# Patient Record
Sex: Female | Born: 1960 | Race: Black or African American | Hispanic: No | Smoking: Current some day smoker
Health system: Southern US, Community
[De-identification: ages and names within clinical notes are randomized; demographics above are authoritative.]

## PROBLEM LIST (undated history)

## (undated) DIAGNOSIS — E7800 Pure hypercholesterolemia, unspecified: Secondary | ICD-10-CM

## (undated) DIAGNOSIS — I10 Essential (primary) hypertension: Secondary | ICD-10-CM

## (undated) HISTORY — PX: ROTATOR CUFF REPAIR: SHX139

## (undated) HISTORY — PX: ABDOMINAL HYSTERECTOMY: SHX81

---

## 1998-06-06 ENCOUNTER — Other Ambulatory Visit: Admission: RE | Admit: 1998-06-06 | Discharge: 1998-06-06 | Payer: Self-pay | Admitting: Family Medicine

## 1999-06-18 ENCOUNTER — Other Ambulatory Visit: Admission: RE | Admit: 1999-06-18 | Discharge: 1999-06-18 | Payer: Self-pay | Admitting: Family Medicine

## 1999-09-24 ENCOUNTER — Encounter: Payer: Self-pay | Admitting: Family Medicine

## 1999-09-24 ENCOUNTER — Encounter: Admission: RE | Admit: 1999-09-24 | Discharge: 1999-09-24 | Payer: Self-pay | Admitting: Family Medicine

## 2000-09-30 ENCOUNTER — Encounter: Payer: Self-pay | Admitting: Family Medicine

## 2000-09-30 ENCOUNTER — Encounter: Admission: RE | Admit: 2000-09-30 | Discharge: 2000-09-30 | Payer: Self-pay | Admitting: Family Medicine

## 2000-10-06 ENCOUNTER — Encounter: Admission: RE | Admit: 2000-10-06 | Discharge: 2001-01-04 | Payer: Self-pay | Admitting: Family Medicine

## 2001-03-30 ENCOUNTER — Ambulatory Visit (HOSPITAL_BASED_OUTPATIENT_CLINIC_OR_DEPARTMENT_OTHER): Admission: RE | Admit: 2001-03-30 | Discharge: 2001-03-30 | Payer: Self-pay | Admitting: Orthopedic Surgery

## 2002-01-28 ENCOUNTER — Encounter (HOSPITAL_COMMUNITY): Admission: RE | Admit: 2002-01-28 | Discharge: 2002-04-28 | Payer: Self-pay | Admitting: Family Medicine

## 2002-02-02 ENCOUNTER — Other Ambulatory Visit: Admission: RE | Admit: 2002-02-02 | Discharge: 2002-02-02 | Payer: Self-pay | Admitting: Family Medicine

## 2002-02-03 ENCOUNTER — Encounter: Admission: RE | Admit: 2002-02-03 | Discharge: 2002-02-03 | Payer: Self-pay | Admitting: Family Medicine

## 2002-02-03 ENCOUNTER — Encounter: Payer: Self-pay | Admitting: Family Medicine

## 2002-04-19 ENCOUNTER — Inpatient Hospital Stay (HOSPITAL_COMMUNITY): Admission: RE | Admit: 2002-04-19 | Discharge: 2002-04-21 | Payer: Self-pay | Admitting: Obstetrics

## 2003-03-29 ENCOUNTER — Ambulatory Visit (HOSPITAL_COMMUNITY): Admission: RE | Admit: 2003-03-29 | Discharge: 2003-03-29 | Payer: Self-pay | Admitting: Obstetrics

## 2011-03-07 ENCOUNTER — Ambulatory Visit
Admission: RE | Admit: 2011-03-07 | Discharge: 2011-03-07 | Disposition: A | Discharge status: Home / Self Care | Payer: PRIVATE HEALTH INSURANCE | Source: Ambulatory Visit | Attending: Family Medicine | Admitting: Family Medicine

## 2011-03-07 ENCOUNTER — Other Ambulatory Visit: Payer: Self-pay | Admitting: Family Medicine

## 2011-03-07 DIAGNOSIS — R0602 Shortness of breath: Secondary | ICD-10-CM

## 2011-10-09 ENCOUNTER — Ambulatory Visit
Admission: RE | Admit: 2011-10-09 | Discharge: 2011-10-09 | Disposition: A | Discharge status: Home / Self Care | Payer: PRIVATE HEALTH INSURANCE | Source: Ambulatory Visit | Attending: Family Medicine | Admitting: Family Medicine

## 2011-10-09 ENCOUNTER — Other Ambulatory Visit: Payer: Self-pay | Admitting: Family Medicine

## 2011-10-09 DIAGNOSIS — M1250 Traumatic arthropathy, unspecified site: Secondary | ICD-10-CM

## 2011-12-18 ENCOUNTER — Other Ambulatory Visit: Payer: Self-pay | Admitting: Family Medicine

## 2011-12-18 DIAGNOSIS — R109 Unspecified abdominal pain: Secondary | ICD-10-CM

## 2011-12-19 ENCOUNTER — Ambulatory Visit
Admission: RE | Admit: 2011-12-19 | Discharge: 2011-12-19 | Disposition: A | Discharge status: Home / Self Care | Payer: PRIVATE HEALTH INSURANCE | Source: Ambulatory Visit | Attending: Family Medicine | Admitting: Family Medicine

## 2011-12-19 DIAGNOSIS — R109 Unspecified abdominal pain: Secondary | ICD-10-CM

## 2011-12-19 MED ORDER — IOHEXOL 300 MG/ML  SOLN
100.0000 mL | Freq: Once | INTRAMUSCULAR | Status: AC | PRN
  Administered 2011-12-19: 100 mL via INTRAVENOUS

## 2011-12-24 ENCOUNTER — Other Ambulatory Visit: Payer: Self-pay | Admitting: Obstetrics

## 2011-12-24 DIAGNOSIS — Z1231 Encounter for screening mammogram for malignant neoplasm of breast: Secondary | ICD-10-CM

## 2011-12-25 ENCOUNTER — Ambulatory Visit (HOSPITAL_COMMUNITY)
Admission: RE | Admit: 2011-12-25 | Discharge: 2011-12-25 | Disposition: A | Discharge status: Home / Self Care | Payer: Private Health Insurance - Indemnity | Source: Ambulatory Visit | Attending: Obstetrics | Admitting: Obstetrics

## 2011-12-25 DIAGNOSIS — Z1231 Encounter for screening mammogram for malignant neoplasm of breast: Secondary | ICD-10-CM

## 2013-01-21 ENCOUNTER — Other Ambulatory Visit: Payer: Self-pay | Admitting: Family Medicine

## 2013-01-22 ENCOUNTER — Other Ambulatory Visit: Payer: Self-pay | Admitting: Family Medicine

## 2013-07-27 ENCOUNTER — Other Ambulatory Visit: Payer: Self-pay | Admitting: Otolaryngology

## 2013-07-27 DIAGNOSIS — D333 Benign neoplasm of cranial nerves: Secondary | ICD-10-CM

## 2013-08-11 ENCOUNTER — Ambulatory Visit
Admission: RE | Admit: 2013-08-11 | Discharge: 2013-08-11 | Disposition: A | Discharge status: Home / Self Care | Payer: PRIVATE HEALTH INSURANCE | Source: Ambulatory Visit | Attending: Otolaryngology | Admitting: Otolaryngology

## 2013-08-11 DIAGNOSIS — D333 Benign neoplasm of cranial nerves: Secondary | ICD-10-CM

## 2013-08-11 MED ORDER — GADOBENATE DIMEGLUMINE 529 MG/ML IV SOLN
17.0000 mL | Freq: Once | INTRAVENOUS | Status: AC | PRN
  Administered 2013-08-11: 17 mL via INTRAVENOUS

## 2014-05-18 ENCOUNTER — Other Ambulatory Visit: Payer: Self-pay | Admitting: Family Medicine

## 2014-05-18 ENCOUNTER — Ambulatory Visit
Admission: RE | Admit: 2014-05-18 | Discharge: 2014-05-18 | Disposition: A | Discharge status: Home / Self Care | Payer: BLUE CROSS/BLUE SHIELD | Source: Ambulatory Visit | Attending: Family Medicine | Admitting: Family Medicine

## 2014-05-18 DIAGNOSIS — M545 Low back pain: Secondary | ICD-10-CM

## 2015-04-11 ENCOUNTER — Telehealth: Payer: Self-pay

## 2015-04-11 NOTE — Telephone Encounter (Signed)
FMLA PAPERWORK READY 15.00 CHARGE UNLESS PAID

## 2016-02-14 ENCOUNTER — Ambulatory Visit
Admission: RE | Admit: 2016-02-14 | Discharge: 2016-02-14 | Disposition: A | Discharge status: Home / Self Care | Payer: BLUE CROSS/BLUE SHIELD | Source: Ambulatory Visit | Attending: Family Medicine | Admitting: Family Medicine

## 2016-02-14 ENCOUNTER — Other Ambulatory Visit: Payer: Self-pay | Admitting: Family Medicine

## 2016-02-14 DIAGNOSIS — M545 Low back pain, unspecified: Secondary | ICD-10-CM

## 2016-04-24 LAB — HM COLONOSCOPY

## 2017-01-15 ENCOUNTER — Ambulatory Visit (INDEPENDENT_AMBULATORY_CARE_PROVIDER_SITE_OTHER): Payer: BLUE CROSS/BLUE SHIELD

## 2017-01-15 ENCOUNTER — Other Ambulatory Visit: Payer: Self-pay | Admitting: Podiatry

## 2017-01-15 ENCOUNTER — Ambulatory Visit: Payer: BLUE CROSS/BLUE SHIELD | Admitting: Podiatry

## 2017-01-15 DIAGNOSIS — M779 Enthesopathy, unspecified: Secondary | ICD-10-CM | POA: Diagnosis not present

## 2017-01-15 DIAGNOSIS — M79672 Pain in left foot: Secondary | ICD-10-CM

## 2017-01-15 DIAGNOSIS — M79671 Pain in right foot: Secondary | ICD-10-CM | POA: Diagnosis not present

## 2017-01-15 DIAGNOSIS — M2042 Other hammer toe(s) (acquired), left foot: Secondary | ICD-10-CM | POA: Diagnosis not present

## 2017-01-15 DIAGNOSIS — M7750 Other enthesopathy of unspecified foot: Secondary | ICD-10-CM | POA: Diagnosis not present

## 2017-01-15 MED ORDER — DICLOFENAC SODIUM 75 MG PO TBEC: 75.0000 mg | DELAYED_RELEASE_TABLET | Freq: Two times a day (BID) | ORAL | 2 refills | Status: DC

## 2017-01-15 MED ORDER — TRIAMCINOLONE ACETONIDE 10 MG/ML IJ SUSP
10.0000 mg | Freq: Once | INTRAMUSCULAR | Status: AC
  Administered 2017-01-15: 10 mg

## 2017-01-15 NOTE — Progress Notes (Signed)
   Subjective:    Patient ID: Carla Clements, female    DOB: 12/19/1960, 56 y.o.   MRN: [id number]  HPI    Review of Systems  All other systems reviewed and are negative.      Objective:   Physical Exam        Assessment & Plan:

## 2017-01-15 NOTE — Patient Instructions (Signed)
Hammer Toe Hammer toe is a change in the shape (a deformity) of your second, third, or fourth toe. The deformity causes the middle joint of your toe to stay bent. This causes pain, especially when you are wearing shoes. Hammer toe starts gradually. At first, the toe can be straightened. Gradually over time, the deformity becomes stiff and permanent. Early treatments to keep the toe straight may relieve pain. As the deformity becomes stiff and permanent, surgery may be needed to straighten the toe. What are the causes? Hammer toe is caused by abnormal bending of the toe joint that is closest to your foot. It happens gradually over time. This pulls on the muscles and connections (tendons) of the toe joint, making them weak and stiff. It is often related to wearing shoes that are too short or narrow and do not let your toes straighten. What increases the risk? You may be at greater risk for hammer toe if you:  Are female.  Are older.  Wear shoes that are too small.  Wear high-heeled shoes that pinch your toes.  Are a ballet dancer.  Have a second toe that is longer than your big toe (first toe).  Injure your foot or toe.  Have arthritis.  Have a family history of hammer toe.  Have a nerve or muscle disorder.  What are the signs or symptoms? The main symptoms of this condition are pain and deformity of the toe. The pain is worse when wearing shoes, walking, or running. Other symptoms may include:  Corns or calluses over the bent part of the toe or between the toes.  Redness and a burning feeling on the toe.  An open sore that forms on the top of the toe.  Not being able to straighten the toe.  How is this diagnosed? This condition is diagnosed based on your symptoms and a physical exam. During the exam, your health care provider will try to straighten your toe to see how stiff the deformity is. You may also have tests, such as:  A blood test to check for rheumatoid  arthritis.  An X-ray to show how severe the deformity is.  How is this treated? Treatment for this condition will depend on how stiff the deformity is. Surgery is often needed. However, sometimes a hammer toe can be straightened without surgery. Treatments that do not involve surgery include:  Taping the toe into a straightened position.  Using pads and cushions to protect the toe (orthotics).  Wearing shoes that provide enough room for the toes.  Doing toe-stretching exercises at home.  Taking an NSAID to reduce pain and swelling.  If these treatments do not help or the toe cannot be straightened, surgery is the next option. The most common surgeries used to straighten a hammer toe include:  Arthroplasty. In this procedure, part of the joint is removed, and that allows the toe to straighten.  Fusion. In this procedure, cartilage between the two bones of the joint is taken out and the bones are fused together into one longer bone.  Implantation. In this procedure, part of the bone is removed and replaced with an implant to let the toe move again.  Flexor tendon transfer. In this procedure, the tendons that curl the toes down (flexor tendons) are repositioned.  Follow these instructions at home:  Take over-the-counter and prescription medicines only as told by your health care provider.  Do toe straightening and stretching exercises as told by your health care provider.  Keep all   follow-up visits as told by your health care provider. This is important. How is this prevented?  Wear shoes that give your toes enough room and do not cause pain.  Do not wear high-heeled shoes. Contact a health care provider if:  Your pain gets worse.  Your toe becomes red or swollen.  You develop an open sore on your toe. This information is not intended to replace advice given to you by your health care provider. Make sure you discuss any questions you have with your health care  provider. Document Released: 02/09/2000 Document Revised: 09/01/2015 Document Reviewed: 06/07/2015 Elsevier Interactive Patient Education  2018 [employer]  

## 2017-01-17 NOTE — Progress Notes (Signed)
Subjective:    Patient ID: Carla Clements, female   DOB: 56 y.o.   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 Patient does not smoke and the like to be more active and states pains been present for several months    Review of Systems  All other systems reviewed and are negative.       Objective:  Physical Exam  Constitutional: She appears well-developed and well-nourished.  Cardiovascular: Intact distal pulses.  Pulmonary/Chest: Effort normal.  Musculoskeletal: Normal range of motion.  Neurological: She is alert.  Skin: Skin is warm.  Nursing note and vitals reviewed.  neurovascular status intact muscle strength adequate range of motion within normal limits with patient found to have inflammation and pain of the second MPJ bilateral with fluid buildup around the joint and pain with palpation. Patient is noted to have good digital perfusion and is well oriented 3     Assessment:    Inflammatory capsulitis second MPJ bilateral with moderate flatfoot deformity and digital deformities with elevation of the digit     Plan:   H&P conditions reviewed and did proximal block left aspirated the joint getting out a small amount of clear fluid and injected with a quarter cc dexamethasone Kenalog and applied thick plantar pad to take pressure off the joint surface. Discussed long-term orthotics and patient wants to have these made and will be seen back to reevaluate  X-rays indicate that there is moderate digital deformity left over right with no indications of advanced arthritis with moderate flatfoot deformity

## 2017-02-05 ENCOUNTER — Ambulatory Visit: Payer: BLUE CROSS/BLUE SHIELD | Admitting: Podiatry

## 2017-02-05 ENCOUNTER — Encounter: Payer: Self-pay | Admitting: Podiatry

## 2017-02-05 DIAGNOSIS — M779 Enthesopathy, unspecified: Secondary | ICD-10-CM | POA: Diagnosis not present

## 2017-02-05 NOTE — Progress Notes (Signed)
Subjective:   Patient ID: Carla Clements, female   DOB: 56 y.o.   MRN: [id number]   HPI Patient presents stating she has had quite a bit of improvement in her forefoot discomfort and stated the padding really helps take pressure off the joint and she does work long shifts and needs some kind of plantar support for her arches.   ROS      Objective:  Physical Exam  Neurovascular status intact with patient's second MPJ being tender bilateral with fifth MPJ keratotic lesion left.     Assessment:  Inflammatory capsulitis still noted with improvement but still painful upon palpation.     Plan:  Reviewed with her long-term orthotics and scanned for custom orthotics by ped orthotist and gave instructions for physical therapy and rigid bottom shoes.  Reappoint when orthotics are returned

## 2017-02-06 ENCOUNTER — Ambulatory Visit: Payer: BLUE CROSS/BLUE SHIELD | Admitting: Podiatry

## 2017-02-27 ENCOUNTER — Encounter: Payer: BLUE CROSS/BLUE SHIELD | Admitting: Orthotics

## 2017-02-27 ENCOUNTER — Ambulatory Visit (INDEPENDENT_AMBULATORY_CARE_PROVIDER_SITE_OTHER): Payer: BLUE CROSS/BLUE SHIELD | Admitting: Orthotics

## 2017-02-27 DIAGNOSIS — M779 Enthesopathy, unspecified: Secondary | ICD-10-CM

## 2017-02-27 DIAGNOSIS — M2042 Other hammer toe(s) (acquired), left foot: Secondary | ICD-10-CM

## 2017-02-27 NOTE — Progress Notes (Signed)
Patient came in today to p/up functional foot orthotics.   The orthotics were assessed to both fit and function.  The F/O addressed the biomechanical issues/pathologies as intended, offering good longitudinal arch support, proper offloading, and foot support. There weren't any signs of discomfort or irritation.  The F/O fit properly in footwear with minimal trimming/adjustments. 

## 2017-04-17 ENCOUNTER — Ambulatory Visit (INDEPENDENT_AMBULATORY_CARE_PROVIDER_SITE_OTHER): Payer: BLUE CROSS/BLUE SHIELD

## 2017-04-17 ENCOUNTER — Encounter: Payer: Self-pay | Admitting: Podiatry

## 2017-04-17 ENCOUNTER — Ambulatory Visit: Payer: BLUE CROSS/BLUE SHIELD | Admitting: Podiatry

## 2017-04-17 ENCOUNTER — Other Ambulatory Visit: Payer: Self-pay | Admitting: Podiatry

## 2017-04-17 DIAGNOSIS — M79671 Pain in right foot: Secondary | ICD-10-CM | POA: Diagnosis not present

## 2017-04-17 DIAGNOSIS — S99921A Unspecified injury of right foot, initial encounter: Secondary | ICD-10-CM

## 2017-04-17 MED ORDER — MELOXICAM 15 MG PO TABS: 15.0000 mg | ORAL_TABLET | Freq: Every day | ORAL | 2 refills | Status: DC

## 2017-04-17 NOTE — Progress Notes (Signed)
Subjective:   Patient ID: Carla Clements, female   DOB: 57 y.o.   MRN: [id number]   HPI Patient presents stating she twisted her right foot and is been very sore and she is having trouble bearing weight on it.  She states that this happened 2 days ago and it is intensely discomforting when she tries to walk   ROS      Objective:  Physical Exam  Neurovascular status intact with significant inflammation of the right foot especially around the fifth metatarsal base and across the midfoot with extreme discomfort upon palpation and mild to moderate swelling     Assessment:  Possibility for fracture of the right midfoot or dislocation injury     Plan:  H&P x-ray reviewed and today I have recommended immobilization with short air fracture walker along with aggressive ice therapy and oral anti-inflammatory therapy.  Patient will be seen back to recheck and I reviewed the fact it does not appear to be a fracture but a serious contusion.  She should be able to return to work within 2-4 weeks  X-rays were negative for signs of fracture or dislocation or Lisfranc's injury

## 2017-04-24 DIAGNOSIS — M79676 Pain in unspecified toe(s): Secondary | ICD-10-CM

## 2017-05-08 ENCOUNTER — Encounter: Payer: Self-pay | Admitting: Podiatry

## 2017-07-08 ENCOUNTER — Telehealth: Payer: Self-pay | Admitting: *Deleted

## 2017-07-08 MED ORDER — MELOXICAM 15 MG PO TABS: 15.0000 mg | ORAL_TABLET | Freq: Every day | ORAL | 2 refills | Status: DC

## 2017-07-08 NOTE — Telephone Encounter (Signed)
Refill request for meloxicam. Dr. Paulla Dolly states refill as previously and pt needs an appt prior to future refills.

## 2017-12-18 ENCOUNTER — Other Ambulatory Visit: Payer: Self-pay

## 2017-12-18 ENCOUNTER — Other Ambulatory Visit (HOSPITAL_COMMUNITY)
Admission: RE | Admit: 2017-12-18 | Discharge: 2017-12-18 | Disposition: A | Discharge status: Home / Self Care | Payer: BLUE CROSS/BLUE SHIELD | Source: Ambulatory Visit | Attending: Family Medicine | Admitting: Family Medicine

## 2017-12-18 DIAGNOSIS — N898 Other specified noninflammatory disorders of vagina: Secondary | ICD-10-CM | POA: Diagnosis present

## 2017-12-23 LAB — URINE CYTOLOGY ANCILLARY ONLY
Candida vaginitis: NEGATIVE
Chlamydia: NEGATIVE
Neisseria Gonorrhea: NEGATIVE
TRICH (WINDOWPATH): NEGATIVE

## 2018-01-06 ENCOUNTER — Other Ambulatory Visit: Payer: Self-pay | Admitting: Family Medicine

## 2018-01-06 ENCOUNTER — Other Ambulatory Visit (HOSPITAL_COMMUNITY)
Admission: RE | Admit: 2018-01-06 | Discharge: 2018-01-06 | Disposition: A | Discharge status: Home / Self Care | Payer: BLUE CROSS/BLUE SHIELD | Source: Ambulatory Visit | Attending: Family Medicine | Admitting: Family Medicine

## 2018-01-06 DIAGNOSIS — N898 Other specified noninflammatory disorders of vagina: Secondary | ICD-10-CM | POA: Diagnosis not present

## 2018-01-09 LAB — URINE CYTOLOGY ANCILLARY ONLY
BACTERIAL VAGINITIS: NEGATIVE
CANDIDA VAGINITIS: NEGATIVE
Chlamydia: NEGATIVE
Neisseria Gonorrhea: NEGATIVE
Trichomonas: NEGATIVE

## 2018-03-09 ENCOUNTER — Encounter (HOSPITAL_COMMUNITY): Payer: Self-pay

## 2018-03-09 ENCOUNTER — Ambulatory Visit (HOSPITAL_COMMUNITY)
Admission: EM | Admit: 2018-03-09 | Discharge: 2018-03-09 | Disposition: A | Discharge status: Home / Self Care | Payer: BLUE CROSS/BLUE SHIELD | Attending: Family Medicine | Admitting: Family Medicine

## 2018-03-09 ENCOUNTER — Other Ambulatory Visit: Payer: Self-pay

## 2018-03-09 DIAGNOSIS — S161XXA Strain of muscle, fascia and tendon at neck level, initial encounter: Secondary | ICD-10-CM | POA: Diagnosis not present

## 2018-03-09 DIAGNOSIS — I10 Essential (primary) hypertension: Secondary | ICD-10-CM | POA: Diagnosis not present

## 2018-03-09 HISTORY — DX: Essential (primary) hypertension: I10

## 2018-03-09 MED ORDER — KETOROLAC TROMETHAMINE 60 MG/2ML IM SOLN
INTRAMUSCULAR | Status: AC
  Filled 2018-03-09: qty 2

## 2018-03-09 MED ORDER — CYCLOBENZAPRINE HCL 10 MG PO TABS: 10.0000 mg | ORAL_TABLET | Freq: Two times a day (BID) | ORAL | 0 refills | Status: DC | PRN

## 2018-03-09 MED ORDER — KETOROLAC TROMETHAMINE 60 MG/2ML IM SOLN
60.0000 mg | Freq: Once | INTRAMUSCULAR | Status: AC
  Administered 2018-03-09: 60 mg via INTRAMUSCULAR

## 2018-03-09 NOTE — Discharge Instructions (Addendum)
Toradol injection given here for pain and inflammation I am sending a muscle relaxant to the pharmacy to take twice a day as needed for muscle spasm Be aware this medication will make you drowsy Work note given Follow up as needed for continued or worsening symptoms
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
## 2018-09-29 ENCOUNTER — Encounter (HOSPITAL_COMMUNITY): Payer: Self-pay

## 2018-09-29 ENCOUNTER — Other Ambulatory Visit: Payer: Self-pay

## 2018-09-29 ENCOUNTER — Ambulatory Visit (HOSPITAL_COMMUNITY)
Admission: EM | Admit: 2018-09-29 | Discharge: 2018-09-29 | Disposition: A | Discharge status: Home / Self Care | Payer: BLUE CROSS/BLUE SHIELD | Attending: Emergency Medicine | Admitting: Emergency Medicine

## 2018-09-29 DIAGNOSIS — Z0110 Encounter for examination of ears and hearing without abnormal findings: Secondary | ICD-10-CM

## 2018-09-29 NOTE — ED Provider Notes (Signed)
HPI  SUBJECTIVE:  Carla Clements is a 58 y.o. female who presents with a possible bug or other foreign body in her left ear starting 2 days ago.  Patient states that she removed a Bluetooth headset, and then felt something moving around in her ear.  She states it was still moving last night.  No pain, otorrhea, change in hearing.  She tried using a Q-tip in her finger without success.  There are no other aggravating or alleviating factors.  Past medical history of hypertension, borderline diabetes, hypercholesterolemia.  PMD: [facility].    Past Medical History:  Diagnosis Date  . Hypertension     Past Surgical History:  Procedure Laterality Date  . ROTATOR CUFF REPAIR      History reviewed. No pertinent family history.  Social History   Tobacco Use  . Smoking status: Current Some Day Smoker  . Smokeless tobacco: Never Used  Substance Use Topics  . Alcohol use: Yes    Frequency: Never  . Drug use: Never    No current facility-administered medications for this encounter.   Current Outpatient Medications:  .  amLODipine (NORVASC) 5 MG tablet, Take 5 mg by mouth daily., Disp: , Rfl:  .  cyclobenzaprine (FLEXERIL) 10 MG tablet, Take 1 tablet (10 mg total) by mouth 2 (two) times daily as needed for muscle spasms., Disp: 20 tablet, Rfl: 0 .  losartan-hydrochlorothiazide (HYZAAR) 50-12.5 MG tablet, Take 1 tablet by mouth daily., Disp: , Rfl:  .  simvastatin (ZOCOR) 20 MG tablet, Take 20 mg by mouth daily., Disp: , Rfl:  .  varenicline (CHANTIX PAK) 0.5 MG X 11 & 1 MG X 42 tablet, Take by mouth 2 (two) times daily. Take one 0.5 mg tablet by mouth once daily for 3 days, then increase to one 0.5 mg tablet twice daily for 4 days, then increase to one 1 mg tablet twice daily., Disp: , Rfl:   No Known Allergies   ROS  As noted in HPI.   Physical Exam  BP (!) 158/93 (BP Location: Right Arm)   Pulse 73   Temp 98.3 F (36.8 C) (Oral)   Resp 18   SpO2 99%   Constitutional:  Well developed, well nourished, no acute distress Eyes:  EOMI, conjunctiva normal bilaterally HENT: Normocephalic, atraumatic,mucus membranes moist.  Left external ear canal very small superficial abrasion in the 4 o'clock position.  TM intact.  No foreign body visualized.  Small amount of nonobstructing wax visualized Respiratory: Normal inspiratory effort Cardiovascular: Normal rate GI: nondistended skin: No rash, skin intact Musculoskeletal: no deformities Neurologic: Alert & oriented x 3, no focal neuro deficits Psychiatric: Speech and behavior appropriate   ED Course   Medications - No data to display  No orders of the defined types were placed in this encounter.   No results found for this or any previous visit (from the past 24 hour(s)). No results found.  ED Clinical Impression  1. Normal ear exam      ED Assessment/Plan  Nothing to remove, no signs of infection.  Do not think that the abrasion is big enough to warrant topical antibiotics.  Suspect that it will heal on its own.  We will have her follow-up with her doctor as needed.  No orders of the defined types were placed in this encounter.   *This clinic note was created using Dragon dictation software. Therefore, there may be occasional mistakes despite careful proofreading.   ?    Melynda Ripple, MD  09/29/18 0953  

## 2018-09-29 NOTE — ED Triage Notes (Signed)
Pt presents with foreign body in left ear X 2 days.

## 2018-09-29 NOTE — Discharge Instructions (Signed)
Have a very small scratch on the back of your ear canal, but this will heal on its own without any problem.  I do not think that she needs antibiotics at this time.  Return here if it starts to hurt, if you start having pus or other fluid, your ear, change in your hearing, or other concerns.  We can consider starting you on y antibiotic eardrops at that time.  Our eardrum was intact and looked normal today.

## 2019-03-20 ENCOUNTER — Encounter (HOSPITAL_COMMUNITY): Payer: Self-pay | Admitting: *Deleted

## 2019-03-20 ENCOUNTER — Ambulatory Visit (HOSPITAL_COMMUNITY)
Admission: EM | Admit: 2019-03-20 | Discharge: 2019-03-20 | Disposition: A | Discharge status: Home / Self Care | Payer: BC Managed Care – PPO

## 2019-03-20 ENCOUNTER — Other Ambulatory Visit: Payer: Self-pay

## 2019-03-20 DIAGNOSIS — S63502A Unspecified sprain of left wrist, initial encounter: Secondary | ICD-10-CM | POA: Diagnosis not present

## 2019-03-20 HISTORY — DX: Pure hypercholesterolemia, unspecified: E78.00

## 2019-03-20 MED ORDER — NAPROXEN 500 MG PO TABS: 500.0000 mg | ORAL_TABLET | Freq: Two times a day (BID) | ORAL | 0 refills | Status: DC

## 2019-03-20 NOTE — ED Triage Notes (Signed)
Denies known injury.  C/O left wrist pain onset @ 2100 last night.  LUE CMS intact.  Has taken Tylenol without significant relief.

## 2019-03-20 NOTE — ED Provider Notes (Signed)
[facility]    CSN: [id number] Arrival date & time: 03/20/19  1040      History   Chief Complaint Chief Complaint  Patient presents with  . Wrist Pain    HPI Carla Clements is a 59 y.o. female.   Patient here concerned with L wrist pain x last night.  Pain started after Carla Clements used her hands to push herself up off the toilet.  Admits pain lateral aspect dorsum L wrist, w/ radiation to hand/forearm.  Denies ecchymosis, swelling, n/t, admits weakness, RROM.  Carla Clements is taking tylenol w/o relief.       Past Medical History:  Diagnosis Date  . Hypercholesteremia   . Hypertension     There are no problems to display for this patient.   Past Surgical History:  Procedure Laterality Date  . ABDOMINAL HYSTERECTOMY    . ROTATOR CUFF REPAIR      OB History   No obstetric history on file.      Home Medications    Prior to Admission medications   Medication Sig Start Date End Date Taking? Authorizing Provider  amLODipine (NORVASC) 5 MG tablet Take 5 mg by mouth daily.   Yes [provider]  cyclobenzaprine (FLEXERIL) 10 MG tablet Take 1 tablet (10 mg total) by mouth 2 (two) times daily as needed for muscle spasms. 03/09/18  Yes Bast, Traci A, NP  Ferrous Sulfate (IRON PO) Take by mouth.   Yes [provider]  losartan-hydrochlorothiazide (HYZAAR) 50-12.5 MG tablet Take 1 tablet by mouth daily.   Yes [provider]  Multiple Vitamins-Minerals (CENTRUM SILVER ADULT 50+ PO) Take by mouth.   Yes [provider]  Multiple Vitamins-Minerals (HAIR SKIN AND NAILS FORMULA PO) Take by mouth.   Yes [provider]  simvastatin (ZOCOR) 20 MG tablet Take 20 mg by mouth daily.   Yes [provider]  naproxen (NAPROSYN) 500 MG tablet Take 1 tablet (500 mg total) by mouth 2 (two) times daily. 03/20/19   Peri Jefferson, PA-C  varenicline (CHANTIX PAK) 0.5 MG X 11 & 1 MG X 42 tablet Take by mouth 2 (two) times daily. Take one 0.5  mg tablet by mouth once daily for 3 days, then increase to one 0.5 mg tablet twice daily for 4 days, then increase to one 1 mg tablet twice daily.    [provider]    Family History Family History  Problem Relation Age of Onset  . Hypertension Mother   . Emphysema Mother   . Cirrhosis Father     Social History Social History   Tobacco Use  . Smoking status: Current Some Day Smoker  . Smokeless tobacco: Never Used  Substance Use Topics  . Alcohol use: Yes    Comment: rarely  . Drug use: Never     Allergies   Other   Review of Systems Review of Systems  Constitutional: Negative for appetite change, fatigue and fever.  Gastrointestinal: Negative for nausea and vomiting.  Musculoskeletal: Positive for arthralgias. Negative for joint swelling and myalgias.  Skin: Negative for color change, rash and wound.  Allergic/Immunologic: Negative for immunocompromised state.  Neurological: Negative for weakness and numbness.  Hematological: Negative for adenopathy.  Psychiatric/Behavioral: Negative for confusion and sleep disturbance.     Physical Exam Triage Vital Signs ED Triage Vitals  Enc Vitals Group     BP 03/20/19 1142 (!) 159/98     Pulse Rate 03/20/19 1142 73     Resp 03/20/19  1142 18     Temp 03/20/19 1143 98.1 F (36.7 C)     Temp Source 03/20/19 1143 Oral     SpO2 03/20/19 1142 100 %     Weight --      Height --      Head Circumference --      Peak Flow --      Pain Score 03/20/19 1141 5     Pain Loc --      Pain Edu? --      Excl. in [place]? --    No data found.  Updated Vital Signs BP (!) 145/90 (BP Location: Left Arm)   Pulse 73   Temp 98.1 F (36.7 C) (Oral)   Resp 18   SpO2 100%   Visual Acuity Right Eye Distance:   Left Eye Distance:   Bilateral Distance:    Right Eye Near:   Left Eye Near:    Bilateral Near:     Physical Exam Vitals and nursing note reviewed.  Constitutional:      General: Carla Clements is not in acute distress.     Appearance: Carla Clements is well-developed.  HENT:     Head: Normocephalic and atraumatic.  Eyes:     Conjunctiva/sclera: Conjunctivae normal.  Cardiovascular:     Rate and Rhythm: Normal rate and regular rhythm.     Heart sounds: No murmur.  Pulmonary:     Effort: Pulmonary effort is normal. No respiratory distress.     Breath sounds: Normal breath sounds.  Abdominal:     Palpations: Abdomen is soft.     Tenderness: There is no abdominal tenderness.  Musculoskeletal:     Left wrist: Tenderness and bony tenderness present. No swelling or deformity. Normal range of motion. Normal pulse.     Left hand: No tenderness or bony tenderness.     Cervical back: Neck supple.  Skin:    General: Skin is warm and dry.     Capillary Refill: Capillary refill takes less than 2 seconds.  Neurological:     General: No focal deficit present.     Mental Status: Carla Clements is alert and oriented to person, place, and time.  Psychiatric:        Mood and Affect: Mood normal.        Behavior: Behavior normal.      UC Treatments / Results  Labs (all labs ordered are listed, but only abnormal results are displayed) Labs Reviewed - No data to display  EKG   Radiology No results found.  Procedures Procedures (including critical care time)  Medications Ordered in UC Medications - No data to display  Initial Impression / Assessment and Plan / UC Course  I have reviewed the triage vital signs and the nursing notes.  Pertinent labs & imaging results that were available during my care of the patient were reviewed by me and considered in my medical decision making (see chart for details).      Final Clinical Impressions(s) / UC Diagnoses   Final diagnoses:  Sprain of left wrist, initial encounter     Discharge Instructions     Follow up with PCP Ice wrist 15 minutes 4 times per day. Wear splint while at work and sleeping. Do exercises daily. Take medication as prescribed.    ED Prescriptions      Medication Sig Dispense Auth. Provider   naproxen (NAPROSYN) 500 MG tablet Take 1 tablet (500 mg total) by mouth 2 (two) times daily. 30 tablet Peri Jefferson,  PA-C     PDMP not reviewed this encounter.   Peri Jefferson, PA-C 03/20/19 1215

## 2019-03-20 NOTE — Discharge Instructions (Addendum)
Follow up with PCP Ice wrist 15 minutes 4 times per day. Wear splint while at work and sleeping. Do exercises daily. Take medication as prescribed.

## 2020-05-01 ENCOUNTER — Other Ambulatory Visit: Payer: Self-pay

## 2020-05-01 ENCOUNTER — Other Ambulatory Visit: Payer: Self-pay | Admitting: Nurse Practitioner

## 2020-05-01 ENCOUNTER — Ambulatory Visit
Admission: RE | Admit: 2020-05-01 | Discharge: 2020-05-01 | Disposition: A | Discharge status: Home / Self Care | Payer: BC Managed Care – PPO | Source: Ambulatory Visit | Attending: Nurse Practitioner | Admitting: Nurse Practitioner

## 2020-05-01 DIAGNOSIS — M545 Low back pain, unspecified: Secondary | ICD-10-CM

## 2021-05-10 DIAGNOSIS — E669 Obesity, unspecified: Secondary | ICD-10-CM | POA: Insufficient documentation

## 2021-05-10 DIAGNOSIS — K5904 Chronic idiopathic constipation: Secondary | ICD-10-CM | POA: Insufficient documentation

## 2021-05-10 DIAGNOSIS — D509 Iron deficiency anemia, unspecified: Secondary | ICD-10-CM | POA: Insufficient documentation

## 2021-10-16 DIAGNOSIS — I10 Essential (primary) hypertension: Secondary | ICD-10-CM | POA: Insufficient documentation

## 2021-11-13 IMAGING — CR DG LUMBAR SPINE COMPLETE 4+V
5 series · 5 of 5 positions shown · non-contrast
Comparison: None.

CLINICAL DATA: Back pain

EXAM:
LUMBAR SPINE - COMPLETE 4+ VIEW

[t lumbar spine ap]
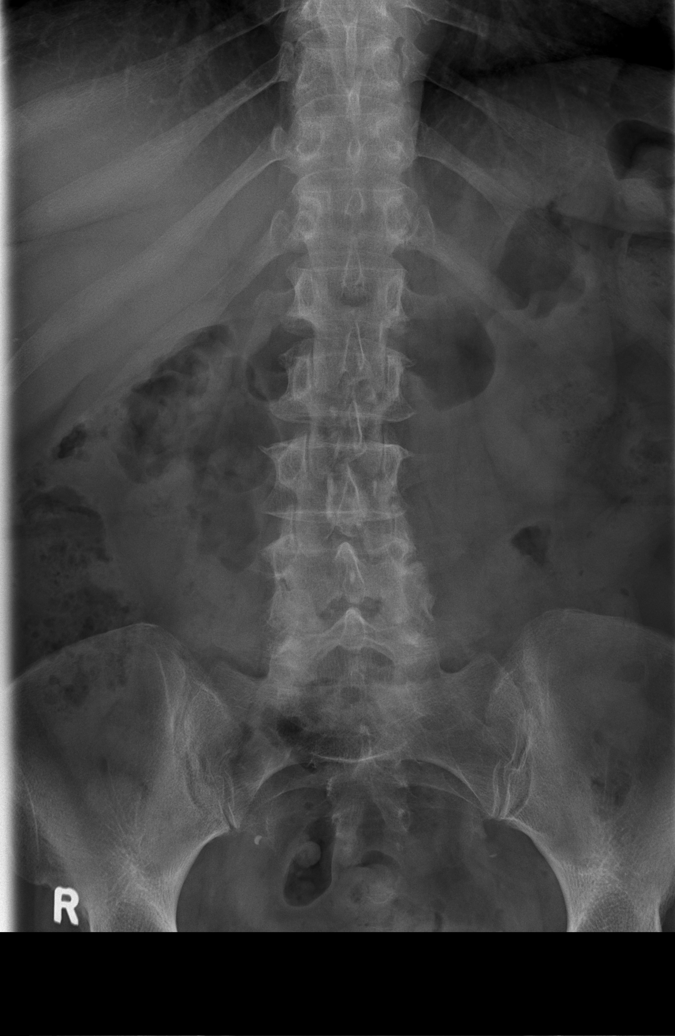

[t lumbar spine obl (1 of 2)]
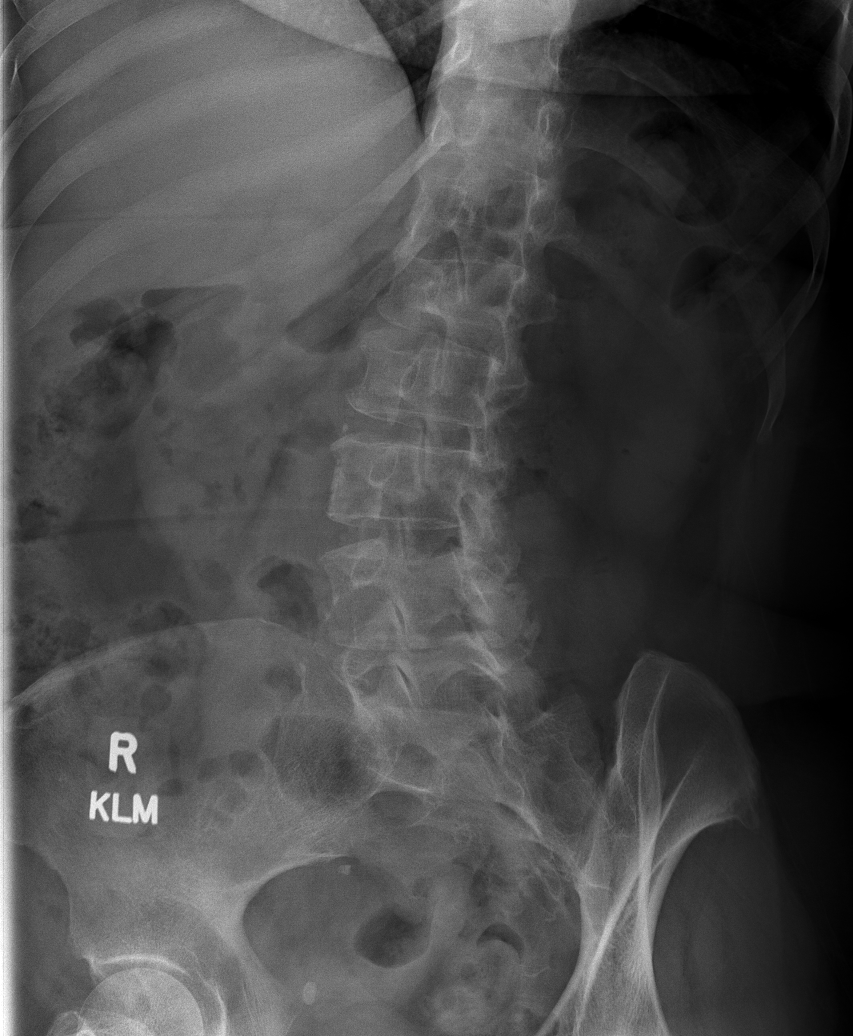

[t lumbar spine obl (2 of 2)]
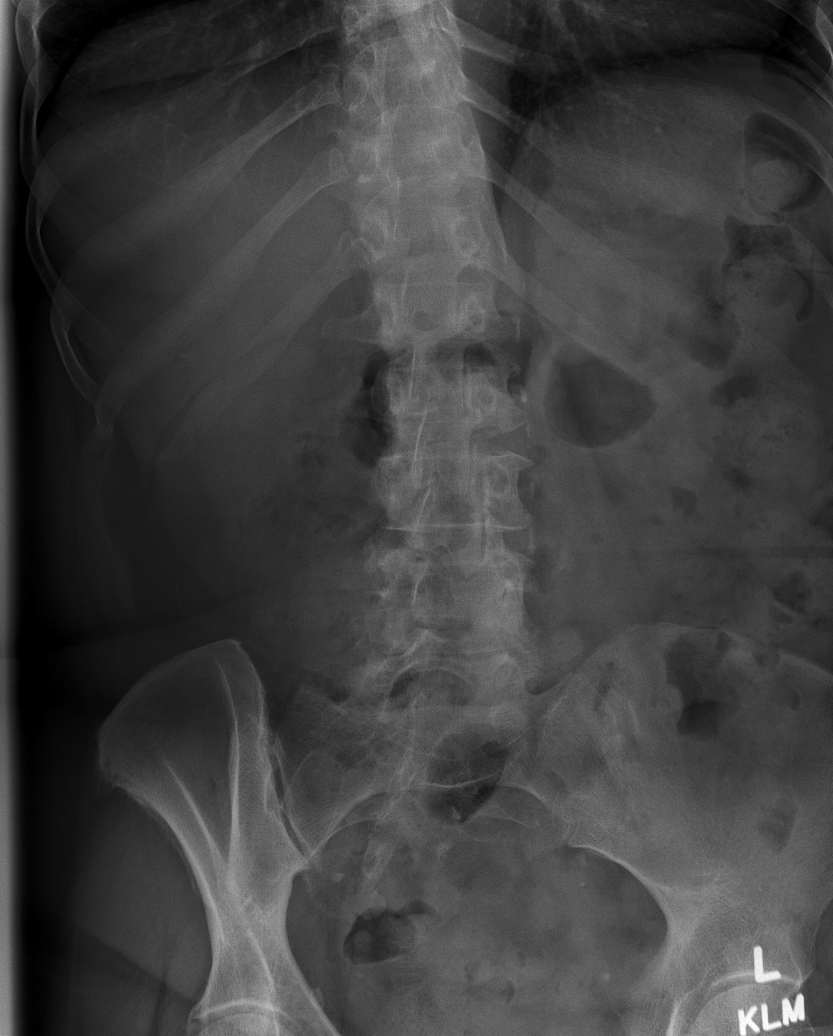

[t lumbar spine lat]
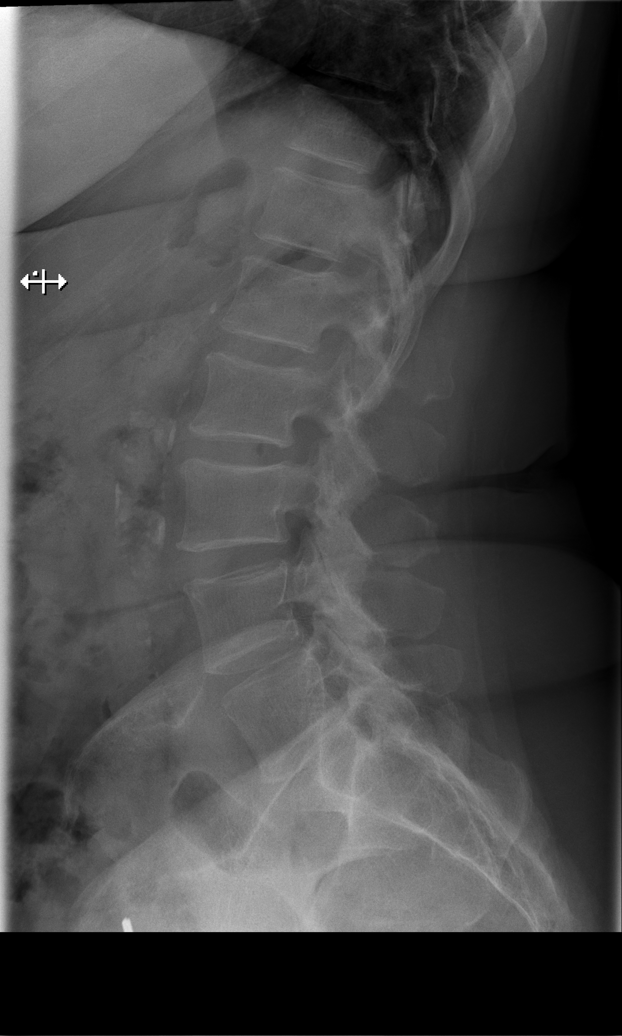

[t lumbar l-5 s-1 spot]
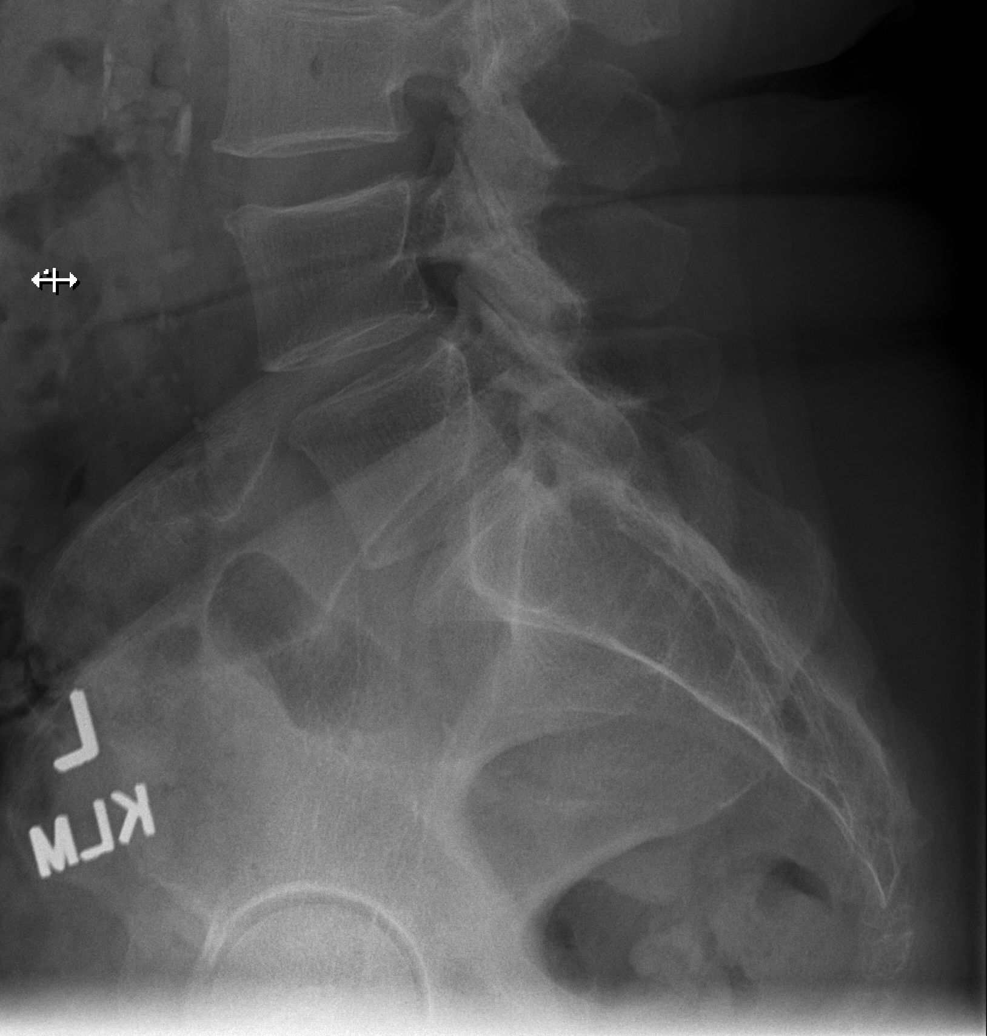

[5 of 5 positions shown; findings below may reference images not displayed]

FINDINGS: Normal lumbar lordosis. No acute fracture or listhesis of the lumbar
spine. Vertebral body height and intervertebral disc heights are
preserved. Minimal endplate remodeling is noted at L3-4 and mild
intervertebral disc space narrowing at L4-5 is noted in keeping with
changes of mild degenerative disc disease. Oblique views demonstrate
no evidence of pars defect. The paraspinal soft tissues are
unremarkable. Vascular calcifications are noted within the abdominal
aorta.
IMPRESSION: Mild degenerative disc disease.

## 2022-01-07 ENCOUNTER — Emergency Department (HOSPITAL_BASED_OUTPATIENT_CLINIC_OR_DEPARTMENT_OTHER)
Admission: EM | Admit: 2022-01-07 | Discharge: 2022-01-07 | Disposition: A | Discharge status: Home / Self Care | Payer: BC Managed Care – PPO | Attending: Emergency Medicine | Admitting: Emergency Medicine

## 2022-01-07 ENCOUNTER — Encounter (HOSPITAL_BASED_OUTPATIENT_CLINIC_OR_DEPARTMENT_OTHER): Payer: Self-pay | Admitting: Emergency Medicine

## 2022-01-07 ENCOUNTER — Other Ambulatory Visit: Payer: Self-pay

## 2022-01-07 ENCOUNTER — Emergency Department (HOSPITAL_BASED_OUTPATIENT_CLINIC_OR_DEPARTMENT_OTHER): Payer: BC Managed Care – PPO

## 2022-01-07 DIAGNOSIS — I10 Essential (primary) hypertension: Secondary | ICD-10-CM | POA: Diagnosis not present

## 2022-01-07 DIAGNOSIS — R109 Unspecified abdominal pain: Secondary | ICD-10-CM | POA: Diagnosis not present

## 2022-01-07 DIAGNOSIS — Z79899 Other long term (current) drug therapy: Secondary | ICD-10-CM | POA: Diagnosis not present

## 2022-01-07 DIAGNOSIS — Z91010 Allergy to peanuts: Secondary | ICD-10-CM | POA: Insufficient documentation

## 2022-01-07 DIAGNOSIS — R112 Nausea with vomiting, unspecified: Secondary | ICD-10-CM | POA: Insufficient documentation

## 2022-01-07 DIAGNOSIS — R197 Diarrhea, unspecified: Secondary | ICD-10-CM | POA: Diagnosis not present

## 2022-01-07 LAB — CBC
HCT: 37.3 % (ref 36.0–46.0)
Hemoglobin: 12.1 g/dL (ref 12.0–15.0)
MCH: 22.2 pg — ABNORMAL LOW (ref 26.0–34.0)
MCHC: 32.4 g/dL (ref 30.0–36.0)
MCV: 68.6 fL — ABNORMAL LOW (ref 80.0–100.0)
Platelets: 364 10*3/uL (ref 150–400)
RBC: 5.44 MIL/uL — ABNORMAL HIGH (ref 3.87–5.11)
RDW: 16.8 % — ABNORMAL HIGH (ref 11.5–15.5)
WBC: 16.1 10*3/uL — ABNORMAL HIGH (ref 4.0–10.5)
nRBC: 0 % (ref 0.0–0.2)

## 2022-01-07 LAB — COMPREHENSIVE METABOLIC PANEL
ALT: 17 U/L (ref 0–44)
AST: 19 U/L (ref 15–41)
Albumin: 4.6 g/dL (ref 3.5–5.0)
Alkaline Phosphatase: 106 U/L (ref 38–126)
Anion gap: 13 (ref 5–15)
BUN: 23 mg/dL (ref 8–23)
CO2: 26 mmol/L (ref 22–32)
Calcium: 10.6 mg/dL — ABNORMAL HIGH (ref 8.9–10.3)
Chloride: 100 mmol/L (ref 98–111)
Creatinine, Ser: 1.01 mg/dL — ABNORMAL HIGH (ref 0.44–1.00)
GFR, Estimated: >60 mL/min (ref >60)
Glucose, Bld: 123 mg/dL — ABNORMAL HIGH (ref 70–99)
Potassium: 3.3 mmol/L — ABNORMAL LOW (ref 3.5–5.1)
Sodium: 139 mmol/L (ref 135–145)
Total Bilirubin: 0.6 mg/dL (ref 0.3–1.2)
Total Protein: 9.1 g/dL — ABNORMAL HIGH (ref 6.5–8.1)

## 2022-01-07 LAB — URINALYSIS, ROUTINE W REFLEX MICROSCOPIC
Bilirubin Urine: NEGATIVE
Glucose, UA: NEGATIVE mg/dL
Hgb urine dipstick: NEGATIVE
Ketones, ur: NEGATIVE mg/dL
Leukocytes,Ua: NEGATIVE
Nitrite: NEGATIVE
Protein, ur: 30 mg/dL — AB
Specific Gravity, Urine: 1.02 (ref 1.005–1.030)
pH: 5.5 (ref 5.0–8.0)

## 2022-01-07 LAB — LIPASE, BLOOD: Lipase: 20 U/L (ref 11–51)

## 2022-01-07 MED ORDER — HYDROCODONE-ACETAMINOPHEN 5-325 MG PO TABS
1.0000 | ORAL_TABLET | Freq: Once | ORAL | Status: AC
  Administered 2022-01-07: 1 via ORAL
  Filled 2022-01-07: qty 1

## 2022-01-07 MED ORDER — POTASSIUM CHLORIDE CRYS ER 20 MEQ PO TBCR
40.0000 meq | EXTENDED_RELEASE_TABLET | Freq: Once | ORAL | Status: AC
  Administered 2022-01-07: 40 meq via ORAL
  Filled 2022-01-07: qty 2

## 2022-01-07 MED ORDER — IOHEXOL 300 MG/ML  SOLN
100.0000 mL | Freq: Once | INTRAMUSCULAR | Status: AC | PRN
  Administered 2022-01-07: 85 mL via INTRAVENOUS

## 2022-01-07 MED ORDER — ONDANSETRON HCL 4 MG PO TABS: 4.0000 mg | ORAL_TABLET | Freq: Four times a day (QID) | ORAL | 0 refills | Status: DC

## 2022-01-07 MED ORDER — SODIUM CHLORIDE 0.9 % IV BOLUS
1000.0000 mL | Freq: Once | INTRAVENOUS | Status: AC
  Administered 2022-01-07: 1000 mL via INTRAVENOUS

## 2022-01-07 MED ORDER — ONDANSETRON 4 MG PO TBDP
4.0000 mg | ORAL_TABLET | Freq: Once | ORAL | Status: AC
  Administered 2022-01-07: 4 mg via ORAL
  Filled 2022-01-07: qty 1

## 2022-01-07 NOTE — ED Notes (Signed)
PO challenge complete.

## 2022-01-07 NOTE — ED Triage Notes (Signed)
Pt arrives to ED with c/o left sided flank pain, diarrhea, and vomiting that started on 11/11.

## 2022-01-07 NOTE — Discharge Instructions (Addendum)
Note the work-up today was overall reassuring.  As discussed, recommend follow-up with PCP in 3 to 5 days for reevaluation of your symptoms.  In the meantime, take Zofran as needed for nausea/emesis or vomiting.  Recommend bland diet.  Please not hesitate to return to emergency department if the worrisome signs and symptoms we discussed become apparent.

## 2022-01-07 NOTE — ED Provider Notes (Addendum)
[facility] EMERGENCY DEPT Provider Note   CSN: [id number] Arrival date & time: 01/07/22  0901     History  Chief Complaint  Patient presents with   Flank Pain   Emesis   Diarrhea    Carla Clements is a 61 y.o. female.   Flank Pain  Emesis Associated symptoms: diarrhea   Diarrhea Associated symptoms: vomiting     61 year old female presents emergency department with complaints of left-sided flank pain, diarrhea and vomiting that began abruptly 2 days ago.  Patient states that she was sitting and being in noted acute onset left-sided flank pain.  Since then, she noted associated symptoms of nausea with vomiting without hematemesis.  Reports some diffuse mild anterior abdominal tenderness.  Denies fever, chills, night sweats, chest pain, shortness of breath, cough, congestion, sore throat, urinary/vaginal symptoms, change in bowel habits.  She states she is taken no medication for this.  She states that p.o. intake has been limited secondary to feelings of nausea and episodes of emesis.  Denies history of abdominal surgeries or nephrolithiasis.  Past medical history significant for hypertension, hypercholesterolemia.  Home Medications Prior to Admission medications   Medication Sig Start Date End Date Taking? Authorizing Provider  ondansetron (ZOFRAN) 4 MG tablet Take 1 tablet (4 mg total) by mouth every 6 (six) hours. 01/07/22  Yes Dion Saucier A, PA  amLODipine (NORVASC) 5 MG tablet Take 5 mg by mouth daily.    [provider]  cyclobenzaprine (FLEXERIL) 10 MG tablet Take 1 tablet (10 mg total) by mouth 2 (two) times daily as needed for muscle spasms. 03/09/18   Loura Halt A, NP  Ferrous Sulfate (IRON PO) Take by mouth.    [provider]  losartan-hydrochlorothiazide (HYZAAR) 50-12.5 MG tablet Take 1 tablet by mouth daily.    [provider]  Multiple Vitamins-Minerals (CENTRUM SILVER ADULT 50+ PO) Take by mouth.    [provider]  Multiple Vitamins-Minerals (HAIR SKIN AND NAILS FORMULA PO) Take by mouth.    [provider]  naproxen (NAPROSYN) 500 MG tablet Take 1 tablet (500 mg total) by mouth 2 (two) times daily. 03/20/19   Peri Jefferson, PA-C  simvastatin (ZOCOR) 20 MG tablet Take 20 mg by mouth daily.    [provider]  varenicline (CHANTIX PAK) 0.5 MG X 11 & 1 MG X 42 tablet Take by mouth 2 (two) times daily. Take one 0.5 mg tablet by mouth once daily for 3 days, then increase to one 0.5 mg tablet twice daily for 4 days, then increase to one 1 mg tablet twice daily.    [provider]      Allergies    Citric acid, Other, and Peanut-containing drug products    Review of Systems   Review of Systems  Gastrointestinal:  Positive for diarrhea and vomiting.  Genitourinary:  Positive for flank pain.  All other systems reviewed and are negative.   Physical Exam Updated Vital Signs BP 131/82   Pulse 75   Temp 98.7 F (37.1 C) (Oral)   Resp (!) 26   Ht '5\' 7"'$  (1.702 m)   Wt 86.2 kg   SpO2 98%   BMI 29.76 kg/m  Physical Exam Vitals and nursing note reviewed.  Constitutional:      General: She is not in acute distress.    Appearance: She is well-developed.  HENT:     Head: Normocephalic and atraumatic.  Eyes:     Conjunctiva/sclera: Conjunctivae normal.  Cardiovascular:  Rate and Rhythm: Normal rate and regular rhythm.     Heart sounds: No murmur heard. Pulmonary:     Effort: Pulmonary effort is normal. No respiratory distress.     Breath sounds: Normal breath sounds. No stridor. No wheezing, rhonchi or rales.  Abdominal:     Palpations: Abdomen is soft.     Tenderness: There is no abdominal tenderness. There is left CVA tenderness. There is no right CVA tenderness or guarding.  Musculoskeletal:        General: No swelling.     Cervical back: Normal range of motion and neck supple. No rigidity or tenderness.     Right lower leg: No edema.     Left  lower leg: No edema.  Skin:    General: Skin is warm and dry.     Capillary Refill: Capillary refill takes less than 2 seconds.  Neurological:     Mental Status: She is alert.  Psychiatric:        Mood and Affect: Mood normal.     ED Results / Procedures / Treatments   Labs (all labs ordered are listed, but only abnormal results are displayed) Labs Reviewed  COMPREHENSIVE METABOLIC PANEL - Abnormal; Notable for the following components:      Result Value   Potassium 3.3 (*)    Glucose, Bld 123 (*)    Creatinine, Ser 1.01 (*)    Calcium 10.6 (*)    Total Protein 9.1 (*)    All other components within normal limits  CBC - Abnormal; Notable for the following components:   WBC 16.1 (*)    RBC 5.44 (*)    MCV 68.6 (*)    MCH 22.2 (*)    RDW 16.8 (*)    All other components within normal limits  URINALYSIS, ROUTINE W REFLEX MICROSCOPIC - Abnormal; Notable for the following components:   Protein, ur 30 (*)    All other components within normal limits  LIPASE, BLOOD    EKG None  Radiology CT Abdomen Pelvis W Contrast  Result Date: 01/07/2022 CLINICAL DATA:  Abdominal pain, acute, nonlocalized. Left-sided flank pain, vomiting, and diarrhea. EXAM: CT ABDOMEN AND PELVIS WITH CONTRAST TECHNIQUE: Multidetector CT imaging of the abdomen and pelvis was performed using the standard protocol following bolus administration of intravenous contrast. RADIATION DOSE REDUCTION: This exam was performed according to the departmental dose-optimization program which includes automated exposure control, adjustment of the mA and/or kV according to patient size and/or use of iterative reconstruction technique. CONTRAST:  60m OMNIPAQUE IOHEXOL 300 MG/ML  SOLN COMPARISON:  CT abdomen and pelvis 12/19/2011 FINDINGS: Lower chest: Minimal dependent atelectasis in the lung bases. Small fat-containing left-sided Bochdalek's hernia. Hepatobiliary: No focal liver abnormality is seen. No gallstones, gallbladder  wall thickening, or biliary dilatation. Pancreas: Unremarkable. Spleen: Unremarkable. Adrenals/Urinary Tract: Unremarkable adrenal glands. No evidence of a renal mass, calculi, or hydronephrosis. Unremarkable bladder. Stomach/Bowel: The stomach is unremarkable. There is no evidence of bowel obstruction or inflammation. The appendix is unremarkable. Vascular/Lymphatic: Abdominal aortic atherosclerosis without aneurysm. No enlarged lymph nodes. Reproductive: Status post hysterectomy. No adnexal masses. Other: No ascites or pneumoperitoneum. Musculoskeletal: No acute osseous abnormality or suspicious osseous lesion. Moderately advanced lumbar facet arthrosis. IMPRESSION: 1. No acute abnormality identified in the abdomen or pelvis. 2.  Aortic Atherosclerosis (ICD10-I70.0). Electronically Signed   By: ALogan BoresM.D.   On: 01/07/2022 14:52    Procedures Procedures    Medications Ordered in ED Medications  ondansetron (ZOFRAN-ODT) disintegrating tablet 4  mg (4 mg Oral Given 01/07/22 1346)  HYDROcodone-acetaminophen (NORCO/VICODIN) 5-325 MG per tablet 1 tablet (1 tablet Oral Given 01/07/22 1357)  sodium chloride 0.9 % bolus 1,000 mL (1,000 mLs Intravenous New Bag/Given 01/07/22 1416)  potassium chloride SA (KLOR-CON M) CR tablet 40 mEq (40 mEq Oral Given 01/07/22 1357)  iohexol (OMNIPAQUE) 300 MG/ML solution 100 mL (85 mLs Intravenous Contrast Given 01/07/22 1431)    ED Course/ Medical Decision Making/ A&P Clinical Course as of 01/07/22 1539  Mon Jan 07, 2022  1525 CT Abdomen Pelvis W Contrast [CR]    Clinical Course User Index [CR] Wilnette Kales, PA                           Medical Decision Making Amount and/or Complexity of Data Reviewed Labs: ordered. Radiology: ordered. Decision-making details documented in ED Course.  Risk Prescription drug management.   This patient presents to the ED for concern of abdominal pain, this involves an extensive number of treatment options, and is  a complaint that carries with it a high risk of complications and morbidity.  The differential diagnosis includes discontinue ischemia, nephrolithiasis, cholelithiasis, pyelonephritis, diverticulitis, volvulus, cholecystitis, diverticulitis, ovarian torsion, SBO/LBO, AAA, aortic dissection   Co morbidities that complicate the patient evaluation  See HPI   Additional history obtained:  Additional history obtained from EMR External records from outside source obtained and reviewed including hospital records   Lab Tests:  I Ordered, and personally interpreted labs.  The pertinent results include: Leukocytosis of 16.1.  No evidence of anemia.  Platelets within normal range.  Mild hypokalemia which is supplemented orally while in the emergency department.  Mild hypercalcemia of 10.6.  No transaminitis noted.  Mild elevation of creatinine with no elevation of BUN and normal GFR.  UA significant for 30 proteins but within normal range otherwise.  Lipase within normal range.   Imaging Studies ordered:  I ordered imaging studies including CT abdomen pelvis. I independently visualized and interpreted imaging which showed no acute abnormalities.  Aortic atherosclerosis.   I agree with the radiologist interpretation   Cardiac Monitoring: / EKG:  The patient was maintained on a cardiac monitor.  I personally viewed and interpreted the cardiac monitored which showed an underlying rhythm of: Sinus rhythm   Consultations Obtained:  N/a   Problem List / ED Course / Critical interventions / Medication management  Viral gastroenteritis I ordered medication including 1 L normal saline, Zofran for nausea, Norco for pain, potassium 4 hypokalemia.    Reevaluation of the patient after these medicines showed that the patient improved I have reviewed the patients home medicines and have made adjustments as needed   Social Determinants of Health:  Denies tobacco, illicit drug use.   Test /  Admission - Considered:  Abdominal pain Vitals signs within normal range and stable throughout visit. Laboratory/imaging studies significant for: See above Patient symptoms likely secondary to viral gastroenteritis.  No acute abdominal pathology found on abdominal exam.  No perinephric stranding or evidence of nephrolithiasis/ureterolithiasis.  Patient's left flank pain could be muscular in pathology.  Doubt AAA/aortic dissection given symmetric pulses, normal blood pressure and lack of concerning findings on HPI.  Patient to be scribed Zofran to take as needed for nausea/emesis.  She states that diarrhea has ceased since being in the emergency department.  Patient tolerated p.o. in emergency department without difficulty with both solids and liquids.  Recommended bland/brat diet outpatient initially.  Follow-up with  PCP recommended in 3 to 5 days reevaluation.  Treatment plan discussed with patient she can understand was agreeable to said plan. Worrisome signs and symptoms were discussed with the patient, and the patient acknowledged understanding to return to the ED if noticed. Patient was stable upon discharge.          Final Clinical Impression(s) / ED Diagnoses Final diagnoses:  Abdominal pain, unspecified abdominal location  Nausea vomiting and diarrhea    Rx / DC Orders ED Discharge Orders          Ordered    ondansetron (ZOFRAN) 4 MG tablet  Every 6 hours        01/07/22 1526              Wilnette Kales, [place] 01/07/22 1539    Tegeler, Gwenyth Allegra, MD 01/07/22 [place], [place], [place] 01/07/22 1541    Tegeler, Gwenyth Allegra, MD 01/07/22 1558

## 2022-02-07 ENCOUNTER — Encounter: Payer: Self-pay | Admitting: Nurse Practitioner

## 2022-02-07 ENCOUNTER — Ambulatory Visit: Payer: BC Managed Care – PPO | Admitting: Nurse Practitioner

## 2022-02-07 VITALS — BP 122/70 | HR 86 | Temp 98.5°F | Ht 67.0 in | Wt 187.0 lb

## 2022-02-07 DIAGNOSIS — M2550 Pain in unspecified joint: Secondary | ICD-10-CM | POA: Diagnosis not present

## 2022-02-07 DIAGNOSIS — Z114 Encounter for screening for human immunodeficiency virus [HIV]: Secondary | ICD-10-CM

## 2022-02-07 DIAGNOSIS — Z862 Personal history of diseases of the blood and blood-forming organs and certain disorders involving the immune mechanism: Secondary | ICD-10-CM

## 2022-02-07 DIAGNOSIS — Z7689 Persons encountering health services in other specified circumstances: Secondary | ICD-10-CM

## 2022-02-07 DIAGNOSIS — R5383 Other fatigue: Secondary | ICD-10-CM

## 2022-02-07 DIAGNOSIS — F3289 Other specified depressive episodes: Secondary | ICD-10-CM | POA: Diagnosis not present

## 2022-02-07 DIAGNOSIS — Z1159 Encounter for screening for other viral diseases: Secondary | ICD-10-CM

## 2022-02-07 DIAGNOSIS — E782 Mixed hyperlipidemia: Secondary | ICD-10-CM

## 2022-02-07 DIAGNOSIS — Z2821 Immunization not carried out because of patient refusal: Secondary | ICD-10-CM

## 2022-02-07 DIAGNOSIS — Z720 Tobacco use: Secondary | ICD-10-CM

## 2022-02-07 MED ORDER — ESCITALOPRAM OXALATE 10 MG PO TABS: 10.0000 mg | ORAL_TABLET | Freq: Every day | ORAL | 1 refills | Status: DC

## 2022-02-07 MED ORDER — MELOXICAM 15 MG PO TABS: 15.0000 mg | ORAL_TABLET | Freq: Every day | ORAL | 1 refills | Status: DC

## 2022-02-07 MED ORDER — SIMVASTATIN 20 MG PO TABS: 20.0000 mg | ORAL_TABLET | Freq: Every day | ORAL | 1 refills | Status: DC

## 2022-02-07 MED ORDER — FERROUS SULFATE 325 (65 FE) MG PO TBEC: 325.0000 mg | DELAYED_RELEASE_TABLET | Freq: Two times a day (BID) | ORAL | 3 refills | Status: DC

## 2022-02-07 MED ORDER — LOSARTAN POTASSIUM-HCTZ 50-12.5 MG PO TABS: 1.0000 | ORAL_TABLET | Freq: Every day | ORAL | 1 refills | Status: DC

## 2022-02-07 NOTE — Patient Instructions (Addendum)
Hypertension, Adult High blood pressure (hypertension) is when the force of blood pumping through the arteries is too strong. The arteries are the blood vessels that carry blood from the heart throughout the body. Hypertension forces the heart to work harder to pump blood and may cause arteries to become narrow or stiff. Untreated or uncontrolled hypertension can lead to a heart attack, heart failure, a stroke, kidney disease, and other problems. A blood pressure reading consists of a higher number over a lower number. Ideally, your blood pressure should be below 120/80. The first ("top") number is called the systolic pressure. It is a measure of the pressure in your arteries as your heart beats. The second ("bottom") number is called the diastolic pressure. It is a measure of the pressure in your arteries as the heart relaxes. What are the causes? The exact cause of this condition is not known. There are some conditions that result in high blood pressure. What increases the risk? Certain factors may make you more likely to develop high blood pressure. Some of these risk factors are under your control, including: Smoking. Not getting enough exercise or physical activity. Being overweight. Having too much fat, sugar, calories, or salt (sodium) in your diet. Drinking too much alcohol. Other risk factors include: Having a personal history of heart disease, diabetes, high cholesterol, or kidney disease. Stress. Having a family history of high blood pressure and high cholesterol. Having obstructive sleep apnea. Age. The risk increases with age. What are the signs or symptoms? High blood pressure may not cause symptoms. Very high blood pressure (hypertensive crisis) may cause: Headache. Fast or irregular heartbeats (palpitations). Shortness of breath. Nosebleed. Nausea and vomiting. Vision changes. Severe chest pain, dizziness, and seizures. How is this diagnosed? This condition is diagnosed by  measuring your blood pressure while you are seated, with your arm resting on a flat surface, your legs uncrossed, and your feet flat on the floor. The cuff of the blood pressure monitor will be placed directly against the skin of your upper arm at the level of your heart. Blood pressure should be measured at least twice using the same arm. Certain conditions can cause a difference in blood pressure between your right and left arms. If you have a high blood pressure reading during one visit or you have normal blood pressure with other risk factors, you may be asked to: Return on a different day to have your blood pressure checked again. Monitor your blood pressure at home for 1 week or longer. If you are diagnosed with hypertension, you may have other blood or imaging tests to help your health care provider understand your overall risk for other conditions. How is this treated? This condition is treated by making healthy lifestyle changes, such as eating healthy foods, exercising more, and reducing your alcohol intake. You may be referred for counseling on a healthy diet and physical activity. Your health care provider may prescribe medicine if lifestyle changes are not enough to get your blood pressure under control and if: Your systolic blood pressure is above 130. Your diastolic blood pressure is above 80. Your personal target blood pressure may vary depending on your medical conditions, your age, and other factors. Follow these instructions at home: Eating and drinking  Eat a diet that is high in fiber and potassium, and low in sodium, added sugar, and fat. An example of this eating plan is called the DASH diet. DASH stands for Dietary Approaches to Stop Hypertension. To eat this way: Eat   plenty of fresh fruits and vegetables. Try to fill one half of your plate at each meal with fruits and vegetables. Eat whole grains, such as whole-wheat pasta, brown rice, or whole-grain bread. Fill about one  fourth of your plate with whole grains. Eat or drink low-fat dairy products, such as skim milk or low-fat yogurt. Avoid fatty cuts of meat, processed or cured meats, and poultry with skin. Fill about one fourth of your plate with lean proteins, such as fish, chicken without skin, beans, eggs, or tofu. Avoid pre-made and processed foods. These tend to be higher in sodium, added sugar, and fat. Reduce your daily sodium intake. Many people with hypertension should eat less than 1,500 mg of sodium a day. Do not drink alcohol if: Your health care provider tells you not to drink. You are pregnant, may be pregnant, or are planning to become pregnant. If you drink alcohol: Limit how much you have to: 0-1 drink a day for women. 0-2 drinks a day for men. Know how much alcohol is in your drink. In the [place] one drink equals one 12 oz bottle of beer (355 mL), one 5 oz glass of wine (148 mL), or one 1 oz glass of hard liquor (44 mL). Lifestyle  Work with your health care provider to maintain a healthy body weight or to lose weight. Ask what an ideal weight is for you. Get at least 30 minutes of exercise that causes your heart to beat faster (aerobic exercise) most days of the week. Activities may include walking, swimming, or biking. Include exercise to strengthen your muscles (resistance exercise), such as Pilates or lifting weights, as part of your weekly exercise routine. Try to do these types of exercises for 30 minutes at least 3 days a week. Do not use any products that contain nicotine or tobacco. These products include cigarettes, chewing tobacco, and vaping devices, such as e-cigarettes. If you need help quitting, ask your health care provider. Monitor your blood pressure at home as told by your health care provider. Keep all follow-up visits. This is important. Medicines Take over-the-counter and prescription medicines only as told by your health care provider. Follow directions carefully. Blood  pressure medicines must be taken as prescribed. Do not skip doses of blood pressure medicine. Doing this puts you at risk for problems and can make the medicine less effective. Ask your health care provider about side effects or reactions to medicines that you should watch for. Contact a health care provider if you: Think you are having a reaction to a medicine you are taking. Have headaches that keep coming back (recurring). Feel dizzy. Have swelling in your ankles. Have trouble with your vision. Get help right away if you: Develop a severe headache or confusion. Have unusual weakness or numbness. Feel faint. Have severe pain in your chest or abdomen. Vomit repeatedly. Have trouble breathing. These symptoms may be an emergency. Get help right away. Call 911. Do not wait to see if the symptoms will go away. Do not drive yourself to the hospital. Summary Hypertension is when the force of blood pumping through your arteries is too strong. If this condition is not controlled, it may put you at risk for serious complications. Your personal target blood pressure may vary depending on your medical conditions, your age, and other factors. For most people, a normal blood pressure is less than 120/80. Hypertension is treated with lifestyle changes, medicines, or a combination of both. Lifestyle changes include losing weight, eating a healthy,   low-sodium diet, exercising more, and limiting alcohol. This information is not intended to replace advice given to you by your health care provider. Make sure you discuss any questions you have with your health care provider. Document Revised: 12/19/2020 Document Reviewed: 12/19/2020 Elsevier Patient Education  [place].   Joint Pain Joint pain may be caused by many things. Joint pain is likely to go away when you follow instructions from your health care provider for relieving pain at home. However, joint pain can also be caused by conditions  that require more treatment. Common causes of joint pain include: Bruising in the area of the joint. Injury caused by repeating certain movements too many times. Age-related joint wear and tear. Buildup of uric acid crystals in the joint (gout). Inflammation of the joint. Other forms of arthritis. Infections of the joint or of the bone. Your health care provider may recommend that you take pain medicine or wear a supportive device like an elastic bandage, sling, or splint. If your joint pain continues, you may need lab or imaging tests to diagnose the cause of your joint pain. Follow these instructions at home: Managing pain, stiffness, and swelling     If directed, put ice on the painful area. To do this: If you have a removable elastic bandage, sling, or splint, take it off as told by your doctor. Put ice in a plastic bag. Place a towel between your skin and the bag. Leave the ice on for 20 minutes, 2-3 times a day. Remove the ice if your skin turns bright red. This is very important. If you cannot feel pain, heat, or cold, you have a greater risk of damage to the area. Move your fingers and toes often to reduce stiffness and swelling. Raise the injured area above the level of your heart while you are sitting or lying down. If directed, apply heat to the painful area as often as told by your health care provider. Use the heat source that your health care provider recommends, such as a moist heat pack or a heating pad. Place a towel between your skin and the heat source. Leave the heat on for 20-30 minutes. Remove the heat if your skin turns bright red. This is especially important if you are unable to feel pain, heat, or cold. You have a greater risk of getting burned.  Activity Rest as told by your health care provider. Do not do anything that causes or worsens pain. Begin exercising or stretching the affected area as told by your health care provider. Return to your normal  activities as told by your health care provider. Ask your health care provider what activities are safe for you. If you have an elastic bandage, sling, or splint: Wear the bandage, sling, or splint as told by your health care provider. Remove it only as told by your health care provider. Loosen it if your fingers or toes below the joint tingle, become numb, or turn cold and blue. Keep it clean. Ask your health care provider if you should remove it before bathing. If the bandage, sling, or splint is not waterproof: Do not let it get wet. Cover it with a watertight covering when you take a bath or shower. General instructions Treatment may include medicines for pain and inflammation that are taken by mouth or applied to the skin. Take over-the-counter and prescription medicines only as told by your health care provider. Do not use any products that contain nicotine or tobacco, such as cigarettes,  e-cigarettes, and chewing tobacco. If you need help quitting, ask your health care provider. Keep all follow-up visits. This is important. Contact a health care provider if: You have pain that gets worse and does not get better with medicine. Your joint pain does not improve within 3 days. You have increased bruising or swelling. You have a fever. You lose 10 lb (4.5 kg) or more without trying. Get help right away if: You cannot move the joint. Your fingers or toes tingle, become numb, or turn cold and blue. You have a fever along with a joint that is red, warm, and swollen. Summary Joint pain may be caused by many things. Your health care provider may recommend that you take pain medicine or wear a supportive device such as an elastic bandage, sling, or splint. If your joint pain continues, you may need tests to diagnose the cause of your joint pain. Take over-the-counter and prescription medicines only as told by your health care provider. This information is not intended to replace advice given  to you by your health care provider. Make sure you discuss any questions you have with your health care provider. Document Revised: 05/26/2019 Document Reviewed: 05/26/2019 Elsevier Patient Education  [place].

## 2022-02-07 NOTE — Progress Notes (Signed)
I,Tianna Badgett,acting as a Education administrator for [employer], FNP.,have documented all relevant documentation on the behalf of Minette Brine, FNP,as directed by  Minette Brine, FNP while in the presence of Minette Brine, [place].  Subjective:     Patient ID: Carla Clements , female    DOB: 16-Jan-1961 , 61 y.o.   MRN: [id number]   Chief Complaint  Patient presents with   Establish Care    HPI  Patient presents today to establish care. She was going to [employer]. Had went to urgent care on [address] then [facility]. She works at [employer]. Divorced. She had 2 children one daughter and one son who passed from sickle cell.   PMH - HTN (for more than 10 years), hypercholesteremia.  She eats an increased amount of seafood.   She has had a hysterectomy in 1993. She denies having ovarian or cervical cancer.   Father - passed from cirrhosis of liver. Mother - COPD - deceased. Brother - healthy     Past Medical History:  Diagnosis Date   Hypercholesteremia    Hypertension      Family History  Problem Relation Age of Onset   Hypertension Mother    Emphysema Mother    Cirrhosis Father      Current Outpatient Medications:    ferrous sulfate 325 (65 FE) MG EC tablet, Take 1 tablet (325 mg total) by mouth 2 (two) times daily., Disp: 60 tablet, Rfl: 3   meloxicam (MOBIC) 15 MG tablet, Take 1 tablet (15 mg total) by mouth daily., Disp: 30 tablet, Rfl: 1   ondansetron (ZOFRAN) 4 MG tablet, Take 1 tablet (4 mg total) by mouth every 6 (six) hours., Disp: 12 tablet, Rfl: 0   cyclobenzaprine (FLEXERIL) 10 MG tablet, Take 1 tablet (10 mg total) by mouth 2 (two) times daily as needed for muscle spasms., Disp: 20 tablet, Rfl: 0   escitalopram (LEXAPRO) 10 MG tablet, Take 1 tablet (10 mg total) by mouth daily., Disp: 90 tablet, Rfl: 1   losartan-hydrochlorothiazide (HYZAAR) 50-12.5 MG tablet, Take 1 tablet by mouth daily., Disp: 90 tablet, Rfl: 1   simvastatin (ZOCOR) 20 MG tablet, Take 1 tablet  (20 mg total) by mouth at bedtime., Disp: 90 tablet, Rfl: 1   Vitamin D, Ergocalciferol, (DRISDOL) 1.25 MG (50000 UNIT) CAPS capsule, Take 1 capsule (50,000 Units total) by mouth every 7 (seven) days., Disp: 12 capsule, Rfl: 1   Allergies  Allergen Reactions   Citric Acid Other (See Comments)   Other Swelling    peanuts   Peanut-Containing Drug Products Other (See Comments)     Review of Systems  Constitutional:  Positive for fatigue.  HENT: Negative.    Eyes: Negative.   Respiratory: Negative.    Cardiovascular: Negative.   Gastrointestinal: Negative.   Endocrine: Negative.   Allergic/Immunologic: Negative.   Neurological: Negative.   Hematological: Negative.        History of anemia  Psychiatric/Behavioral: Negative.       Today's Vitals   02/07/22 1000  BP: 122/70  Pulse: 86  Temp: 98.5 F (36.9 C)  TempSrc: Oral  Weight: 187 lb (84.8 kg)  Height: '5\' 7"'$  (1.702 m)   Body mass index is 29.29 kg/m.   Objective:  Physical Exam Vitals reviewed.  Constitutional:      Appearance: She is well-developed.  HENT:     Head: Normocephalic and atraumatic.  Eyes:     Pupils: Pupils are equal, round, and reactive to light.  Cardiovascular:  Rate and Rhythm: Normal rate and regular rhythm.     Pulses: Normal pulses.     Heart sounds: Normal heart sounds. No murmur heard. Pulmonary:     Effort: Pulmonary effort is normal. No respiratory distress.     Breath sounds: Normal breath sounds. No wheezing.  Musculoskeletal:        General: Normal range of motion.  Skin:    General: Skin is warm and dry.     Capillary Refill: Capillary refill takes less than 2 seconds.  Neurological:     General: No focal deficit present.     Mental Status: She is alert and oriented to person, place, and time.     Cranial Nerves: No cranial nerve deficit.     Motor: No weakness.  Psychiatric:        Mood and Affect: Mood normal.         Assessment And Plan:     1. Arthralgia,  unspecified joint - meloxicam (MOBIC) 15 MG tablet; Take 1 tablet (15 mg total) by mouth daily.  Dispense: 30 tablet; Refill: 1  2. Mixed hyperlipidemia - simvastatin (ZOCOR) 20 MG tablet; Take 1 tablet (20 mg total) by mouth at bedtime.  Dispense: 90 tablet; Refill: 1  3. Other fatigue Comments: Will check for metabolic causes - TSH - Vitamin B12 - Vitamin D (25 hydroxy) - Iron, TIBC and Ferritin Panel  4. Other depression Comments: Currently well-controlled per patient, continue escitalopram - escitalopram (LEXAPRO) 10 MG tablet; Take 1 tablet (10 mg total) by mouth daily.  Dispense: 90 tablet; Refill: 1  5. Encounter for hepatitis C screening test for low risk patient Will check Hepatitis C screening due to recent recommendations to screen all adults 18 years and older - Hepatitis C antibody  6. Encounter for HIV (human immunodeficiency virus) test - HIV Antibody (routine testing w rflx)  7. Tobacco abuse Comments: She has a more than 20 pack/year history of smoking we will check low-dose CT scan - CT CHEST LUNG CA SCREEN LOW DOSE W/O CM; Future  8. History of anemia - ferrous sulfate 325 (65 FE) MG EC tablet; Take 1 tablet (325 mg total) by mouth 2 (two) times daily.  Dispense: 60 tablet; Refill: 3  9. Influenza vaccination declined Patient declined influenza vaccination at this time. Patient is aware that influenza vaccine prevents illness in 70% of healthy people, and reduces hospitalizations to 30-70% in elderly. This vaccine is recommended annually. Education has been provided regarding the importance of this vaccine but patient still declined. Advised may receive this vaccine at local pharmacy or Health Dept.or vaccine clinic. Aware to provide a copy of the vaccination record if obtained from local pharmacy or Health Dept.  Pt is willing to accept risk associated with refusing vaccination.  10. Establishing care with new doctor, encounter for     Patient was given  opportunity to ask questions. Patient verbalized understanding of the plan and was able to repeat key elements of the plan. All questions were answered to their satisfaction.  Minette Brine, FNP   I, Minette Brine, FNP, have reviewed all documentation for this visit. The documentation on 02/07/22 for the exam, diagnosis, procedures, and orders are all accurate and complete.   IF YOU HAVE BEEN REFERRED TO A SPECIALIST, IT MAY TAKE 1-2 WEEKS TO SCHEDULE/PROCESS THE REFERRAL. IF YOU HAVE NOT HEARD FROM US/SPECIALIST IN TWO WEEKS, PLEASE GIVE [place] A CALL AT [phone].   THE PATIENT IS ENCOURAGED TO PRACTICE  SOCIAL DISTANCING DUE TO THE COVID-19 PANDEMIC.

## 2022-02-08 ENCOUNTER — Other Ambulatory Visit: Payer: Self-pay | Admitting: Nurse Practitioner

## 2022-02-08 DIAGNOSIS — E559 Vitamin D deficiency, unspecified: Secondary | ICD-10-CM

## 2022-02-08 LAB — TSH: TSH: 0.478 u[IU]/mL (ref 0.450–4.500)

## 2022-02-08 LAB — VITAMIN D 25 HYDROXY (VIT D DEFICIENCY, FRACTURES): Vit D, 25-Hydroxy: 28.3 ng/mL — ABNORMAL LOW (ref 30.0–100.0)

## 2022-02-08 LAB — IRON,TIBC AND FERRITIN PANEL
Ferritin: 100 ng/mL (ref 15–150)
Iron Saturation: 20 % (ref 15–55)
Iron: 51 ug/dL (ref 27–139)
Total Iron Binding Capacity: 250 ug/dL (ref 250–450)
UIBC: 199 ug/dL (ref 118–369)

## 2022-02-08 LAB — HIV ANTIBODY (ROUTINE TESTING W REFLEX): HIV Screen 4th Generation wRfx: NONREACTIVE

## 2022-02-08 LAB — HEPATITIS C ANTIBODY: Hep C Virus Ab: NONREACTIVE

## 2022-02-08 LAB — VITAMIN B12: Vitamin B-12: 1229 pg/mL (ref 232–1245)

## 2022-02-08 MED ORDER — VITAMIN D (ERGOCALCIFEROL) 1.25 MG (50000 UNIT) PO CAPS: 50000.0000 [IU] | ORAL_CAPSULE | ORAL | 1 refills | Status: DC

## 2022-02-11 LAB — HM MAMMOGRAPHY

## 2022-02-14 ENCOUNTER — Ambulatory Visit: Payer: BLUE CROSS/BLUE SHIELD | Admitting: Nurse Practitioner

## 2022-02-26 ENCOUNTER — Encounter: Payer: Self-pay | Admitting: Nurse Practitioner

## 2022-03-25 ENCOUNTER — Ambulatory Visit: Payer: BC Managed Care – PPO | Admitting: Nurse Practitioner

## 2022-03-25 ENCOUNTER — Encounter: Payer: Self-pay | Admitting: Nurse Practitioner

## 2022-03-25 VITALS — BP 128/70 | HR 68 | Temp 98.4°F | Ht 67.0 in | Wt 189.0 lb

## 2022-03-25 DIAGNOSIS — R5383 Other fatigue: Secondary | ICD-10-CM | POA: Diagnosis not present

## 2022-03-25 DIAGNOSIS — E559 Vitamin D deficiency, unspecified: Secondary | ICD-10-CM | POA: Diagnosis not present

## 2022-03-25 DIAGNOSIS — F3289 Other specified depressive episodes: Secondary | ICD-10-CM | POA: Diagnosis not present

## 2022-03-25 DIAGNOSIS — D333 Benign neoplasm of cranial nerves: Secondary | ICD-10-CM | POA: Insufficient documentation

## 2022-03-25 DIAGNOSIS — Z720 Tobacco use: Secondary | ICD-10-CM

## 2022-03-25 MED ORDER — NICOTINE 21 MG/24HR TD PT24: 21.0000 mg | MEDICATED_PATCH | TRANSDERMAL | 1 refills | Status: AC

## 2022-03-25 NOTE — Progress Notes (Signed)
I,Tianna Badgett,acting as a Education administrator for [employer], FNP.,have documented all relevant documentation on the behalf of Minette Brine, FNP,as directed by  Minette Brine, FNP while in the presence of Minette Brine, [place]. Subjective:     Patient ID: Carla Clements , female    DOB: 08-21-1960 , 62 y.o.   MRN: [id number]   Chief Complaint  Patient presents with   Depression    HPI  Patient presents today for depression follow up. She does take ferrous sulfate - she will increase her water intake      Past Medical History:  Diagnosis Date   Hypercholesteremia    Hypertension      Family History  Problem Relation Age of Onset   Hypertension Mother    Emphysema Mother    Cirrhosis Father      Current Outpatient Medications:    escitalopram (LEXAPRO) 10 MG tablet, Take 1 tablet (10 mg total) by mouth daily., Disp: 90 tablet, Rfl: 1   ferrous sulfate 325 (65 FE) MG EC tablet, Take 1 tablet (325 mg total) by mouth 2 (two) times daily., Disp: 60 tablet, Rfl: 3   losartan-hydrochlorothiazide (HYZAAR) 50-12.5 MG tablet, Take 1 tablet by mouth daily., Disp: 90 tablet, Rfl: 1   meloxicam (MOBIC) 15 MG tablet, Take 1 tablet (15 mg total) by mouth daily., Disp: 30 tablet, Rfl: 1   nicotine (NICODERM CQ - DOSED IN MG/24 HOURS) 21 mg/24hr patch, Place 1 patch (21 mg total) onto the skin daily., Disp: 30 patch, Rfl: 1   ondansetron (ZOFRAN) 4 MG tablet, Take 1 tablet (4 mg total) by mouth every 6 (six) hours., Disp: 12 tablet, Rfl: 0   simvastatin (ZOCOR) 20 MG tablet, Take 1 tablet (20 mg total) by mouth at bedtime., Disp: 90 tablet, Rfl: 1   Vitamin D, Ergocalciferol, (DRISDOL) 1.25 MG (50000 UNIT) CAPS capsule, Take 1 capsule (50,000 Units total) by mouth every 7 (seven) days., Disp: 12 capsule, Rfl: 1   Allergies  Allergen Reactions   Citric Acid Other (See Comments)   Other Swelling    peanuts   Peanut-Containing Drug Products Other (See Comments)     Review of Systems   Constitutional: Negative.   Respiratory: Negative.    Cardiovascular: Negative.   Gastrointestinal: Negative.   Neurological: Negative.      Today's Vitals   03/25/22 1213  BP: 128/70  Pulse: 68  Temp: 98.4 F (36.9 C)  TempSrc: Oral  Weight: 189 lb (85.7 kg)  Height: '5\' 7"'$  (1.702 m)   Body mass index is 29.6 kg/m.   Objective:  Physical Exam Vitals reviewed.  Constitutional:      Appearance: She is well-developed.  HENT:     Head: Normocephalic and atraumatic.  Eyes:     Pupils: Pupils are equal, round, and reactive to light.  Cardiovascular:     Rate and Rhythm: Normal rate and regular rhythm.     Pulses: Normal pulses.     Heart sounds: Normal heart sounds. No murmur heard. Pulmonary:     Effort: Pulmonary effort is normal. No respiratory distress.     Breath sounds: Normal breath sounds. No wheezing.  Musculoskeletal:        General: Normal range of motion.  Skin:    General: Skin is warm and dry.     Capillary Refill: Capillary refill takes less than 2 seconds.  Neurological:     General: No focal deficit present.     Mental Status: She is alert and  oriented to person, place, and time.     Cranial Nerves: No cranial nerve deficit.     Motor: No weakness.  Psychiatric:        Mood and Affect: Mood normal.         Assessment And Plan:     1. Other depression Comments: Stable, continue current medications  2. Other fatigue Comments: All labs are normal except low vitamin d, this has not improved, will send for sleep study. - Ambulatory referral to Sleep Studies  3. Vitamin D deficiency Comments: She took her high dose vitamin d in the first few weeks thinking it was daily. Advised to pick up refill and take weekly  4. Tobacco abuse Comments: She is to call for her Low dose CT scan and I have sent a Rx for nicotine patches. Smoking cessation instruction/counseling given:  counseled patient on the dangers of tobacco use, advised patient to stop  smoking, and reviewed strategies to maximize success - nicotine (NICODERM CQ - DOSED IN MG/24 HOURS) 21 mg/24hr patch; Place 1 patch (21 mg total) onto the skin daily.  Dispense: 30 patch; Refill: 1     Patient was given opportunity to ask questions. Patient verbalized understanding of the plan and was able to repeat key elements of the plan. All questions were answered to their satisfaction.  Minette Brine, FNP   I, Minette Brine, FNP, have reviewed all documentation for this visit. The documentation on 03/25/22 for the exam, diagnosis, procedures, and orders are all accurate and complete.   IF YOU HAVE BEEN REFERRED TO A SPECIALIST, IT MAY TAKE 1-2 WEEKS TO SCHEDULE/PROCESS THE REFERRAL. IF YOU HAVE NOT HEARD FROM US/SPECIALIST IN TWO WEEKS, PLEASE GIVE [place] A CALL AT [phone].   THE PATIENT IS ENCOURAGED TO PRACTICE SOCIAL DISTANCING DUE TO THE COVID-19 PANDEMIC.

## 2022-03-25 NOTE — Patient Instructions (Addendum)
Depression Screening Depression screening is a tool that your health care provider can use to learn if you have symptoms of depression. Depression is a common condition with many symptoms that are also often found in other conditions. Depression is treatable, but it must first be diagnosed. You may not know that certain feelings, thoughts, and behaviors that you are having can be symptoms of depression. Taking a depression screening test can help you and your health care provider decide if you need more assessment, or if you should be referred to a mental health care provider. What are the screening tests? You may have a physical exam to see if another condition is affecting your mental health. You may have a blood or urine sample taken during the physical exam. You may be interviewed or offered a written test using a screening tool that was developed from research, such as one of these: Patient Health Questionnaire (PHQ). This is a set of either 2 or 9 questions. A health care provider who has been trained to score this screening test uses a guide to assess if your symptoms suggest that you may have depression. Hamilton Depression Rating Scale (HAM-D). This is a set of either 17 or 24 questions. You may be asked to take it again during or after your treatment, to see if your depression has gotten better. Beck Depression Inventory (BDI). This is a set of 21 multiple choice questions. Your health care provider scores your answers to assess: Your level of depression, ranging from mild to severe. Your response to treatment. Your health care provider may talk with you about your daily activities, such as eating, sleeping, work, and recreation, and ask if you have had any changes in activity. Your health care provider may ask you to see a mental health specialist, such as a psychiatrist or psychologist, for more evaluation. Who should be screened for depression?  All adults, including adults with a family  history of a mental health disorder. People who are 23-78 years old. People who are recovering from an acute condition, such as myocardial infarction (MI) or stroke. Pregnant women, or women who have given birth. People who have a long-term (chronic) illness. Anyone who has been diagnosed with another type of mental health disorder. Anyone who has symptoms that could show depression. What do my results mean? Your health care provider will review the results of your depression screening, physical exam, and lab tests. Positive screens suggest that you may have depression. Screening is the first step in getting the care that you may need. It will be important for you to know the results of your tests. Ask your health care provider, or the department that is doing your screening tests, when your results will be ready. Talk with your health care provider about your results, diagnosis, and recommendations for follow-up. A diagnosis of depression is made using information from the Diagnostic and Statistical Manual of Mental Disorders (DSM-5). This is a book that lists the number and type of symptoms that must be present for a health care provider to give a specific diagnosis. Your health care provider may work with you to treat your symptoms of depression, or your health care provider may help you find a mental health provider who can assess and help you develop a plan to treat your depression. Get help right away if: You have thoughts about hurting yourself or others. If you ever feel like you may hurt yourself or others, or have thoughts about taking your own life,  get help right away. Go to your nearest emergency department or: Call your local emergency services (911 in the [place] Call a suicide crisis helpline, such as the [place] at 4037131812 or 988 in the [place]. This is open 24 hours a day in the [place] Text the Crisis Text Line at 502 280 8501 (in the  [place] Summary Depression screening is the first step in getting the help that you may need. If your screening test shows symptoms of depression (is positive), your health care provider may ask you to see a mental health provider who will help identify ways to treat your depression. Anyone aged 36 or older should be screened for depression. This information is not intended to replace advice given to you by your health care provider. Make sure you discuss any questions you have with your health care provider. Document Revised: 09/06/2020 Document Reviewed: 05/22/2020 Elsevier Patient Education  [place] for your low dose CT scan [phone]

## 2022-04-03 ENCOUNTER — Encounter: Payer: Self-pay | Admitting: Nurse Practitioner

## 2022-06-10 ENCOUNTER — Ambulatory Visit: Payer: BC Managed Care – PPO | Admitting: Nurse Practitioner

## 2022-06-10 ENCOUNTER — Encounter: Payer: Self-pay | Admitting: Nurse Practitioner

## 2022-06-10 VITALS — BP 112/58 | HR 66 | Temp 98.2°F | Ht 67.0 in | Wt 190.0 lb

## 2022-06-10 DIAGNOSIS — K625 Hemorrhage of anus and rectum: Secondary | ICD-10-CM | POA: Insufficient documentation

## 2022-06-10 DIAGNOSIS — Z1211 Encounter for screening for malignant neoplasm of colon: Secondary | ICD-10-CM

## 2022-06-10 DIAGNOSIS — R1319 Other dysphagia: Secondary | ICD-10-CM | POA: Insufficient documentation

## 2022-06-10 DIAGNOSIS — Z720 Tobacco use: Secondary | ICD-10-CM

## 2022-06-10 DIAGNOSIS — Z800 Family history of malignant neoplasm of digestive organs: Secondary | ICD-10-CM | POA: Insufficient documentation

## 2022-06-10 DIAGNOSIS — M2550 Pain in unspecified joint: Secondary | ICD-10-CM | POA: Diagnosis not present

## 2022-06-10 DIAGNOSIS — Z86010 Personal history of colonic polyps: Secondary | ICD-10-CM | POA: Insufficient documentation

## 2022-06-10 DIAGNOSIS — Z2821 Immunization not carried out because of patient refusal: Secondary | ICD-10-CM

## 2022-06-10 MED ORDER — KETOROLAC TROMETHAMINE 60 MG/2ML IM SOLN
60.0000 mg | Freq: Once | INTRAMUSCULAR | Status: AC
  Administered 2022-06-10: 60 mg via INTRAMUSCULAR

## 2022-06-10 MED ORDER — TRIAMCINOLONE ACETONIDE 40 MG/ML IJ SUSP
40.0000 mg | Freq: Once | INTRAMUSCULAR | Status: AC
  Administered 2022-06-10: 40 mg via INTRAMUSCULAR

## 2022-06-10 NOTE — Patient Instructions (Signed)
[facility]: Anselmo Rod MD - referral for your colonoscopy has been sent  [address]  [phone]

## 2022-06-10 NOTE — Progress Notes (Signed)
I,Sheena H Holbrook,acting as a Neurosurgeon for Arnette Felts, FNP.,have documented all relevant documentation on the behalf of Arnette Felts, FNP,as directed by  Arnette Felts, FNP while in the presence of Arnette Felts, FNP.    Subjective:     Patient ID: Carla Clements , female    DOB: 1960-04-09 , 62 y.o.   MRN: [id number]   Chief Complaint  Patient presents with   Medical Management of Chronic Issues    HPI  Patient presents today for depression follow up.   Patient reports pain from neck to toe, general joint pain and body aches; she states the pain is worse when it rains. Pain is  in her shoulders, elbows and legs mostly. Reports has been ongoing for 2-3 months. She had started taking tumeric. She has taken tylenol and aleve off and on. Denies family history of joint pain issues. Reports pain level 6-10/10. She has taken 2 tylenol in the last 24 hours. She is not exercising as much due to having the pain. When she started with exercising and had plantar fascitis and sciatic nerve pain.   She has been taking a friend's muscle relaxers but these have not helped, she does not recall the name. Patient denies any known swelling to her joints. She also denies any recent injuries/falls.   Reports she does not do well with steroids "they do not work well". She is not taking the meloxicam due to taking another medication either cholesterol medication or depression medicaiton    Past Medical History:  Diagnosis Date   Hypercholesteremia    Hypertension      Family History  Problem Relation Age of Onset   Hypertension Mother    Emphysema Mother    Cirrhosis Father      Current Outpatient Medications:    escitalopram (LEXAPRO) 10 MG tablet, Take 1 tablet (10 mg total) by mouth daily., Disp: 90 tablet, Rfl: 1   ferrous sulfate 325 (65 FE) MG EC tablet, Take 1 tablet (325 mg total) by mouth 2 (two) times daily., Disp: 60 tablet, Rfl: 3   losartan-hydrochlorothiazide (HYZAAR) 50-12.5 MG  tablet, Take 1 tablet by mouth daily., Disp: 90 tablet, Rfl: 1   nicotine (NICODERM CQ - DOSED IN MG/24 HOURS) 21 mg/24hr patch, Place 1 patch (21 mg total) onto the skin daily., Disp: 30 patch, Rfl: 1   ondansetron (ZOFRAN) 4 MG tablet, Take 1 tablet (4 mg total) by mouth every 6 (six) hours., Disp: 12 tablet, Rfl: 0   simvastatin (ZOCOR) 20 MG tablet, Take 1 tablet (20 mg total) by mouth at bedtime., Disp: 90 tablet, Rfl: 1   Vitamin D, Ergocalciferol, (DRISDOL) 1.25 MG (50000 UNIT) CAPS capsule, Take 1 capsule (50,000 Units total) by mouth every 7 (seven) days. (Patient not taking: Reported on 06/10/2022), Disp: 12 capsule, Rfl: 1   Allergies  Allergen Reactions   Citric Acid Other (See Comments)   Other Swelling    peanuts   Peanut-Containing Drug Products Other (See Comments)     Review of Systems  Constitutional: Negative.   Respiratory: Negative.    Cardiovascular: Negative.   Gastrointestinal: Negative.   Musculoskeletal:  Positive for arthralgias and myalgias.  Neurological: Negative.      Today's Vitals   06/10/22 1205  BP: (!) 112/58  Pulse: 66  Temp: 98.2 F (36.8 C)  TempSrc: Oral  SpO2: 97%  Weight: 190 lb (86.2 kg)  Height:  (1.702 m)   Body mass index is 29.76 kg/m.  Objective:  Physical Exam Vitals reviewed.  Constitutional:      General: She is not in acute distress.    Appearance: Normal appearance. She is well-developed.  HENT:     Head: Normocephalic and atraumatic.  Eyes:     Pupils: Pupils are equal, round, and reactive to light.  Cardiovascular:     Rate and Rhythm: Normal rate and regular rhythm.     Pulses: Normal pulses.     Heart sounds: Normal heart sounds. No murmur heard. Pulmonary:     Effort: Pulmonary effort is normal. No respiratory distress.     Breath sounds: Normal breath sounds. No wheezing.  Musculoskeletal:        General: Tenderness (right anterior shoulder at bursa space and with flexion) present. No swelling.  Normal range of motion.  Skin:    General: Skin is warm and dry.     Capillary Refill: Capillary refill takes less than 2 seconds.  Neurological:     General: No focal deficit present.     Mental Status: She is alert and oriented to person, place, and time.     Cranial Nerves: No cranial nerve deficit.     Motor: No weakness.  Psychiatric:        Mood and Affect: Mood normal.         Assessment And Plan:     1. Arthralgia, unspecified joint Comments: This has been ongoing for several weeks but comes and goes, will check autoimmune profile and treat with Toradol and Kenalog. If not better will refer - Autoimmune Profile - CRP (C-Reactive Protein) - ACE (Angiotensin 1 Converting Enzyme) - ketorolac (TORADOL) injection 60 mg - triamcinolone acetonide (KENALOG-40) injection 40 mg  2. Encounter for screening colonoscopy She is due from her 5 years, will refer back to Dr. Loreta Ave - Ambulatory referral to Gastroenterology  3. Herpes zoster vaccination declined Declines shingrix, educated on disease process and is aware if he changes his mind to notify office  4. COVID-19 vaccination declined Declines covid 19 vaccine. Discussed risk of covid 82 and if she changes her mind about the vaccine to call the office. Education has been provided regarding the importance of this vaccine but patient still declined. Advised may receive this vaccine at local pharmacy or Health Dept.or vaccine clinic. Aware to provide a copy of the vaccination record if obtained from local pharmacy or Health Dept.  Encouraged to take multivitamin, vitamin d, vitamin c and zinc to increase immune system. Aware can call office if would like to have vaccine here at office. Verbalized acceptance and understanding.  5. Tobacco abuse Comments: Encouraged to quit smoking as this can cause more pain.    Patient was given opportunity to ask questions. Patient verbalized understanding of the plan and was able to repeat key  elements of the plan. All questions were answered to their satisfaction.  Arnette Felts, FNP   I, Arnette Felts, FNP, have reviewed all documentation for this visit. The documentation on 06/10/22 for the exam, diagnosis, procedures, and orders are all accurate and complete.   IF YOU HAVE BEEN REFERRED TO A SPECIALIST, IT MAY TAKE 1-2 WEEKS TO SCHEDULE/PROCESS THE REFERRAL. IF YOU HAVE NOT HEARD FROM US/SPECIALIST IN TWO WEEKS, PLEASE GIVE [place] A CALL AT [phone].   THE PATIENT IS ENCOURAGED TO PRACTICE SOCIAL DISTANCING DUE TO THE COVID-19 PANDEMIC.

## 2022-06-11 LAB — AUTOIMMUNE PROFILE
Anti Nuclear Antibody (ANA): NEGATIVE
Complement C3, Serum: 150 mg/dL (ref 82–167)
dsDNA Ab: <1 IU/mL (ref 0–9)

## 2022-06-11 LAB — ANGIOTENSIN CONVERTING ENZYME: Angio Convert Enzyme: 42 U/L (ref 14–82)

## 2022-06-11 LAB — C-REACTIVE PROTEIN: CRP: 1 mg/L (ref 0–10)

## 2022-07-27 ENCOUNTER — Emergency Department (HOSPITAL_BASED_OUTPATIENT_CLINIC_OR_DEPARTMENT_OTHER)
Admission: EM | Admit: 2022-07-27 | Discharge: 2022-07-27 | Disposition: A | Discharge status: Home / Self Care | Payer: BC Managed Care – PPO | Attending: Emergency Medicine | Admitting: Emergency Medicine

## 2022-07-27 ENCOUNTER — Other Ambulatory Visit (HOSPITAL_BASED_OUTPATIENT_CLINIC_OR_DEPARTMENT_OTHER): Payer: Self-pay

## 2022-07-27 ENCOUNTER — Other Ambulatory Visit: Payer: Self-pay

## 2022-07-27 ENCOUNTER — Emergency Department (HOSPITAL_BASED_OUTPATIENT_CLINIC_OR_DEPARTMENT_OTHER): Payer: BC Managed Care – PPO

## 2022-07-27 ENCOUNTER — Encounter (HOSPITAL_BASED_OUTPATIENT_CLINIC_OR_DEPARTMENT_OTHER): Payer: Self-pay

## 2022-07-27 ENCOUNTER — Emergency Department (HOSPITAL_BASED_OUTPATIENT_CLINIC_OR_DEPARTMENT_OTHER): Payer: BC Managed Care – PPO | Admitting: Radiology

## 2022-07-27 DIAGNOSIS — S82455A Nondisplaced comminuted fracture of shaft of left fibula, initial encounter for closed fracture: Secondary | ICD-10-CM | POA: Diagnosis not present

## 2022-07-27 DIAGNOSIS — Z91010 Allergy to peanuts: Secondary | ICD-10-CM | POA: Diagnosis not present

## 2022-07-27 DIAGNOSIS — Z79899 Other long term (current) drug therapy: Secondary | ICD-10-CM | POA: Insufficient documentation

## 2022-07-27 DIAGNOSIS — I10 Essential (primary) hypertension: Secondary | ICD-10-CM | POA: Insufficient documentation

## 2022-07-27 DIAGNOSIS — X501XXA Overexertion from prolonged static or awkward postures, initial encounter: Secondary | ICD-10-CM | POA: Insufficient documentation

## 2022-07-27 DIAGNOSIS — M25572 Pain in left ankle and joints of left foot: Secondary | ICD-10-CM | POA: Diagnosis present

## 2022-07-27 MED ORDER — IBUPROFEN 600 MG PO TABS: 600.0000 mg | ORAL_TABLET | Freq: Four times a day (QID) | ORAL | 0 refills | Status: DC | PRN

## 2022-07-27 MED ORDER — IBUPROFEN 400 MG PO TABS
600.0000 mg | ORAL_TABLET | Freq: Once | ORAL | Status: AC
  Administered 2022-07-27: 600 mg via ORAL
  Filled 2022-07-27: qty 1

## 2022-07-27 NOTE — Discharge Instructions (Signed)
As discussed, you have evidence of left-sided fibular fracture.  Will treat this with cam walker boot as well as use crutches as needed to help get around.  Recommend rest, ice, elevation of affected extremity as well as Motrin/Tylenol as needed for pain.  Attached is number for orthopedics to call to set up an appointment.  Please do not hesitate to return to emergency department for worrisome signs and symptoms we discussed become apparent.

## 2022-07-27 NOTE — ED Triage Notes (Signed)
Patient arrives with complaints of left ankle injury. Patient was participating in a physical obstacle course, and fell, twisting her left ankle. Rates pain a 6/10.

## 2022-07-27 NOTE — ED Provider Notes (Signed)
[facility] EMERGENCY DEPARTMENT AT [facility] Provider Note   CSN: [id number] Arrival date & time: 07/27/22  1340     History  Chief Complaint  Patient presents with   Ankle Injury    left    Carla Clements is a 62 y.o. female.   Ankle Injury   62 year old female presents emergency department complaints of left ankle pain.  Patient states that she was in a mud run when her forefoot got caught in the mud causing her foot to externally rotate with subsequent pain.  Patient has been unable to bear weight on foot since then.  Has not removed she was sock.  Denies trauma elsewhere.  Denies trauma to head, loss consciousness, chest pain, shortness of breath, abdominal pain, nausea, vomiting, upper extremity pain, back pain.  Patient denies any weakness or sensory deficits in affected foot.  Past medical history significant for hypertension, hypercholesterolemia, acoustic neuroma,  Home Medications Prior to Admission medications   Medication Sig Start Date End Date Taking? Authorizing Provider  ibuprofen (ADVIL) 600 MG tablet Take 1 tablet (600 mg total) by mouth every 6 (six) hours as needed. 07/27/22  Yes Sherian Maroon A, PA  escitalopram (LEXAPRO) 10 MG tablet Take 1 tablet (10 mg total) by mouth daily. 02/07/22   Arnette Felts, FNP  ferrous sulfate 325 (65 FE) MG EC tablet Take 1 tablet (325 mg total) by mouth 2 (two) times daily. 02/07/22 02/07/23  Arnette Felts, FNP  losartan-hydrochlorothiazide (HYZAAR) 50-12.5 MG tablet Take 1 tablet by mouth daily. 02/07/22   Arnette Felts, FNP  nicotine (NICODERM CQ - DOSED IN MG/24 HOURS) 21 mg/24hr patch Place 1 patch (21 mg total) onto the skin daily. 03/25/22 03/25/23  Arnette Felts, FNP  ondansetron (ZOFRAN) 4 MG tablet Take 1 tablet (4 mg total) by mouth every 6 (six) hours. 01/07/22   Peter Garter, PA  simvastatin (ZOCOR) 20 MG tablet Take 1 tablet (20 mg total) by mouth at bedtime. 02/07/22   Arnette Felts, FNP  Vitamin D,  Ergocalciferol, (DRISDOL) 1.25 MG (50000 UNIT) CAPS capsule Take 1 capsule (50,000 Units total) by mouth every 7 (seven) days. Patient not taking: Reported on 06/10/2022 02/08/22   Arnette Felts, FNP      Allergies    Citric acid, Other, and Peanut-containing drug products    Review of Systems   Review of Systems  All other systems reviewed and are negative.   Physical Exam Updated Vital Signs BP 126/85   Pulse (!) 59   Temp 97.6 F (36.4 C) (Oral)   Resp 19   Ht 5\' 7"  (1.702 m)   Wt 86.2 kg   SpO2 100%   BMI 29.76 kg/m  Physical Exam Vitals and nursing note reviewed.  Constitutional:      General: She is not in acute distress.    Appearance: She is well-developed.  HENT:     Head: Normocephalic and atraumatic.  Eyes:     Conjunctiva/sclera: Conjunctivae normal.  Cardiovascular:     Rate and Rhythm: Normal rate and regular rhythm.     Heart sounds: No murmur heard. Pulmonary:     Effort: Pulmonary effort is normal. No respiratory distress.     Breath sounds: Normal breath sounds.  Abdominal:     Palpations: Abdomen is soft.     Tenderness: There is no abdominal tenderness.  Musculoskeletal:        General: No swelling.     Cervical back: Neck supple.  Comments: Pedal pulses 2+ bilaterally.  Patient with appreciable deformity of lateral aspect of left ankle.  No tender palpation of medial malleolus or forefoot but with tender to palpation of left lateral malleolus diffusely with overlying swelling.  Tenderness with squeezing of syndesmosis of left lower extremity.  No left knee tenderness to palpation.  No obvious breaks in skin.  Skin:    General: Skin is warm and dry.     Capillary Refill: Capillary refill takes less than 2 seconds.  Neurological:     Mental Status: She is alert.  Psychiatric:        Mood and Affect: Mood normal.     ED Results / Procedures / Treatments   Labs (all labs ordered are listed, but only abnormal results are displayed) Labs  Reviewed - No data to display  EKG None  Radiology DG Foot Complete Left  Result Date: 07/27/2022 CLINICAL DATA:  Pain EXAM: LEFT FOOT - COMPLETE 3+ VIEW COMPARISON:  None Available. FINDINGS: There is a fracture of the distal fibula suspected. This will be better visualized on images of the ankle. There is anterior soft tissue swelling as well at the level the distal tibia and fibula. Otherwise, no other fractures are noted. IMPRESSION: 1. There is a fracture of the distal fibula. This will be better visualized on images of the ankle. 2. Anterior soft tissue swelling at the level of the distal tibia and fibula. Electronically Signed   By: Gerome Sam III M.D.   On: 07/27/2022 14:54   DG Tibia/Fibula Left  Result Date: 07/27/2022 CLINICAL DATA:  History of ankle injury.  Fall. EXAM: LEFT TIBIA AND FIBULA - 2 VIEW COMPARISON:  None Available. FINDINGS: The proximal 80% of the tibia and fibula were imaged on this study. The distal femur was also imaged. Within these limitations, visualized femur, tibia, and fibula are intact. IMPRESSION: No fracture identified. The proximal 80% of the tibia and fibula were imaged on this study. The distal femur was also imaged. Electronically Signed   By: Gerome Sam III M.D.   On: 07/27/2022 14:52    Procedures Procedures    Medications Ordered in ED Medications  ibuprofen (ADVIL) tablet 600 mg (600 mg Oral Given 07/27/22 1612)    ED Course/ Medical Decision Making/ A&P                             Medical Decision Making Amount and/or Complexity of Data Reviewed Radiology: ordered.  Risk Prescription drug management.   This patient presents to the ED for concern of ankle pain, this involves an extensive number of treatment options, and is a complaint that carries with it a high risk of complications and morbidity.  The differential diagnosis includes fracture, dislocation, strain/sprain, DVT, PAD, septic arthritis, osteoarthritis,  ligament/tendinous injury, neurovascular compromise   Co morbidities that complicate the patient evaluation  See HPI   Additional history obtained:  Additional history obtained from EMR External records from outside source obtained and reviewed including hospital records   Lab Tests:  N/a   Imaging Studies ordered:  I ordered imaging studies including left hip/fib x-ray, left knee x-ray, left ankle x-ray, CT left ankle I independently visualized and interpreted imaging which showed  Left tibia/fibula x-ray: Noted to fracture/dislocation Left ankle x-ray/foot: Fracture of distal fibula.  Anterior soft tissue swelling at level of distal tibia/fibula CT left ankle: Comminuted metaphysis left fibular fracture without involvement of ankle mortise  I agree with the radiologist interpretation  Cardiac Monitoring: / EKG:  The patient was maintained on a cardiac monitor.  I personally viewed and interpreted the cardiac monitored which showed an underlying rhythm of: Sinus rhythm   Consultations Obtained:  N/a   Problem List / ED Course / Critical interventions / Medication management  Left ankle fracture I ordered medication including Motrin   Reevaluation of the patient after these medicines showed that the patient improved I have reviewed the patients home medicines and have made adjustments as needed   Social Determinants of Health:  Denies tobacco, illicit drug use   Test / Admission - Considered:  Left ankle fracture Vitals signs  within normal range and stable throughout visit. Imaging studies significant for: See above 62 year old female presents emergency department with complaints of left ankle pain after fall.  Patient with evidence of distal metaphyseal fracture of left fibula comminuted nature without significant placement or involvement of left-sided ankle more T's.  Patient without any neurovascular compromise.  Patient placed in cam walker boot and given  crutches to help aid in ambulation.  Will recommend rest, ice, elevation affected extremity as well as treatment of pain/inflammation with Motrin.  Patient recommended follow-up with orthopedics in outpatient pending for further assessment.  Treatment plan discussed at length with patient and she acknowledged understanding was agreeable to said plan.  Patient overall well-appearing, afebrile in no acute distress. Worrisome signs and symptoms were discussed with the patient, and the patient acknowledged understanding to return to the ED if noticed. Patient was stable upon discharge.          Final Clinical Impression(s) / ED Diagnoses Final diagnoses:  Closed nondisplaced comminuted fracture of shaft of left fibula, initial encounter    Rx / DC Orders ED Discharge Orders          Ordered    ibuprofen (ADVIL) 600 MG tablet  Every 6 hours PRN        07/27/22 1758              Peter Garter, PA 07/27/22 Rickey Primus    Ernie Avena, MD 07/28/22 [phone]

## 2022-07-27 NOTE — ED Notes (Signed)
Discharge paperwork given and verbally understood. 

## 2022-07-30 ENCOUNTER — Telehealth: Payer: Self-pay

## 2022-07-30 NOTE — Transitions of Care (Post Inpatient/ED Visit) (Signed)
   07/30/2022  Name: Carla Clements MRN: [id number] DOB: 17-Aug-1960  Today's TOC FU Call Status: Today's TOC FU Call Status:: Unsuccessul Call (1st Attempt) Unsuccessful Call (1st Attempt) Date: 07/30/22  Attempted to reach the patient regarding the most recent Inpatient/ED visit.  Follow Up Plan: Additional outreach attempts will be made to reach the patient to complete the Transitions of Care (Post Inpatient/ED visit) call.   Signature YL,RMA

## 2022-08-09 ENCOUNTER — Other Ambulatory Visit: Payer: Self-pay | Admitting: Nurse Practitioner

## 2022-08-09 DIAGNOSIS — F3289 Other specified depressive episodes: Secondary | ICD-10-CM

## 2022-08-20 ENCOUNTER — Telehealth: Payer: Self-pay | Admitting: Nurse Practitioner

## 2022-08-20 NOTE — Telephone Encounter (Signed)
Called patient to make aware I have a copy of her FMLA forms that were done by her orthopedic for her ankle fracture. She will need to f/u with them if additional information is needed. Will keep copy.

## 2022-09-30 ENCOUNTER — Encounter: Payer: Self-pay | Admitting: Nurse Practitioner

## 2022-10-10 ENCOUNTER — Encounter: Payer: Self-pay | Admitting: Nurse Practitioner

## 2022-10-10 ENCOUNTER — Ambulatory Visit: Payer: BC Managed Care – PPO | Admitting: Nurse Practitioner

## 2022-10-10 VITALS — BP 110/64 | HR 85 | Temp 98.4°F | Ht 67.0 in | Wt 181.0 lb

## 2022-10-10 DIAGNOSIS — R7303 Prediabetes: Secondary | ICD-10-CM

## 2022-10-10 DIAGNOSIS — Z720 Tobacco use: Secondary | ICD-10-CM

## 2022-10-10 DIAGNOSIS — H9191 Unspecified hearing loss, right ear: Secondary | ICD-10-CM

## 2022-10-10 DIAGNOSIS — H6122 Impacted cerumen, left ear: Secondary | ICD-10-CM | POA: Insufficient documentation

## 2022-10-10 DIAGNOSIS — I10 Essential (primary) hypertension: Secondary | ICD-10-CM

## 2022-10-10 DIAGNOSIS — Z6828 Body mass index (BMI) 28.0-28.9, adult: Secondary | ICD-10-CM | POA: Insufficient documentation

## 2022-10-10 DIAGNOSIS — F3289 Other specified depressive episodes: Secondary | ICD-10-CM

## 2022-10-10 DIAGNOSIS — Z0000 Encounter for general adult medical examination without abnormal findings: Secondary | ICD-10-CM

## 2022-10-10 DIAGNOSIS — E559 Vitamin D deficiency, unspecified: Secondary | ICD-10-CM

## 2022-10-10 DIAGNOSIS — Z862 Personal history of diseases of the blood and blood-forming organs and certain disorders involving the immune mechanism: Secondary | ICD-10-CM

## 2022-10-10 DIAGNOSIS — E782 Mixed hyperlipidemia: Secondary | ICD-10-CM | POA: Diagnosis not present

## 2022-10-10 DIAGNOSIS — F32A Depression, unspecified: Secondary | ICD-10-CM | POA: Insufficient documentation

## 2022-10-10 DIAGNOSIS — Z2821 Immunization not carried out because of patient refusal: Secondary | ICD-10-CM

## 2022-10-10 DIAGNOSIS — Z79899 Other long term (current) drug therapy: Secondary | ICD-10-CM

## 2022-10-10 LAB — POCT URINALYSIS DIP (CLINITEK)
Blood, UA: NEGATIVE
Glucose, UA: NEGATIVE mg/dL
Ketones, POC UA: NEGATIVE mg/dL
Leukocytes, UA: NEGATIVE
Nitrite, UA: NEGATIVE
POC PROTEIN,UA: NEGATIVE
Spec Grav, UA: >=1.03 — AB (ref 1.010–1.025)
Urobilinogen, UA: 0.2 E.U./dL
pH, UA: 6 (ref 5.0–8.0)

## 2022-10-10 NOTE — Assessment & Plan Note (Signed)
Cholesterol levels are stable.  Continue statin, tolerating well.

## 2022-10-10 NOTE — Progress Notes (Signed)
Carla Clements, CMA,acting as a Neurosurgeon for Arnette Felts, FNP.,have documented all relevant documentation on the behalf of Arnette Felts, FNP,as directed by  Arnette Felts, FNP while in the presence of Arnette Felts, FNP.  Subjective:    Patient ID: Carla Clements , female    DOB: 20-Apr-1960 , 62 y.o.   MRN: [id number]  Chief Complaint  Patient presents with   Annual Exam    HPI  Patient presents today for HM, patient reports compliance with medications. Patient denies any chest pain, SOB, and headaches. Patient has no other concerns today. Patient reports she had a hysterometry about 10 years ago. She has to be in her cast for another month. She is trying to quit smoking - considering starting a nicotine patch. She is currently at home due to fractured ankle. Has not been eating as healthy  Wt Readings from Last 3 Encounters: 10/10/22 : 181 lb (82.1 kg) 07/27/22 : 190 lb 0.6 oz (86.2 kg) 06/10/22 : 190 lb (86.2 kg)    BP Readings from Last 3 Encounters: 10/10/22 : 110/64 07/27/22 : 126/85 06/10/22 : Marland Kitchen 112/58       Past Medical History:  Diagnosis Date   Hypercholesteremia    Hypertension      Family History  Problem Relation Age of Onset   Hypertension Mother    Emphysema Mother    Cirrhosis Father      Current Outpatient Medications:    escitalopram (LEXAPRO) 10 MG tablet, TAKE 1 TABLET BY MOUTH EVERY DAY, Disp: 90 tablet, Rfl: 1   ferrous sulfate 325 (65 FE) MG EC tablet, Take 1 tablet (325 mg total) by mouth 2 (two) times daily., Disp: 60 tablet, Rfl: 3   losartan-hydrochlorothiazide (HYZAAR) 50-12.5 MG tablet, Take 1 tablet by mouth daily., Disp: 90 tablet, Rfl: 1   nicotine (NICODERM CQ - DOSED IN MG/24 HOURS) 21 mg/24hr patch, Place 1 patch (21 mg total) onto the skin daily., Disp: 30 patch, Rfl: 1   ondansetron (ZOFRAN) 4 MG tablet, Take 1 tablet (4 mg total) by mouth every 6 (six) hours., Disp: 12 tablet, Rfl: 0   simvastatin (ZOCOR) 20 MG tablet, Take 1  tablet (20 mg total) by mouth at bedtime., Disp: 90 tablet, Rfl: 1   Allergies  Allergen Reactions   Citric Acid Other (See Comments)   Other Swelling    peanuts   Peanut-Containing Drug Products Other (See Comments)      The patient states she uses status post hysterectomy for birth control. No LMP recorded. Patient has had a hysterectomy.. Negative for Dysmenorrhea and Negative for Menorrhagia. Negative for: breast discharge, breast lump(s), breast pain and breast self exam. Associated symptoms include abnormal vaginal bleeding. Pertinent negatives include abnormal bleeding (hematology), anxiety, decreased libido, depression, difficulty falling sleep, dyspareunia, history of infertility, nocturia, sexual dysfunction, sleep disturbances, urinary incontinence, urinary urgency, vaginal discharge and vaginal itching. Diet regular.The patient states her exercise level is    . The patient's tobacco use is:  Social History   Tobacco Use  Smoking Status Some Days   Current packs/day: 0.50   Average packs/day: 0.5 packs/day for 44.6 years (22.3 ttl pk-yrs)   Types: Cigarettes   Start date: 02/23/1978  Smokeless Tobacco Never  Tobacco Comments   She is stopping slowly on her own, 03/25/2022 - less than 5 cigs per day  . She has been exposed to passive smoke. The patient's alcohol use is:  Social History   Substance and Sexual Activity  Alcohol  Use Yes   Comment: rarely   Additional information: Last pap unsure, she reports having done by Dr. Clearance Coots  Review of Systems  Constitutional: Negative.   HENT:  Positive for hearing loss (right ear).   Eyes: Negative.   Respiratory: Negative.    Cardiovascular: Negative.   Gastrointestinal: Negative.   Endocrine: Negative.   Genitourinary: Negative.   Musculoskeletal: Negative.        Left ankle with walking boot on.   Skin: Negative.   Allergic/Immunologic: Negative.   Neurological: Negative.   Hematological: Negative.    Psychiatric/Behavioral: Negative.       Today's Vitals   10/10/22 1416  BP: 110/64  Pulse: 85  Temp: 98.4 F (36.9 C)  TempSrc: Oral  Weight: 181 lb (82.1 kg)  Height: 5\' 7"  (1.702 m)  PainSc: 0-No pain   Body mass index is 28.35 kg/m.  Wt Readings from Last 3 Encounters:  10/10/22 181 lb (82.1 kg)  07/27/22 190 lb 0.6 oz (86.2 kg)  06/10/22 190 lb (86.2 kg)     Objective:  Physical Exam Vitals reviewed.  Constitutional:      General: She is not in acute distress.    Appearance: Normal appearance. She is well-developed. She is obese.  HENT:     Head: Normocephalic and atraumatic.     Right Ear: Hearing, tympanic membrane, ear canal and external ear normal. There is no impacted cerumen.     Left Ear: Hearing and external ear normal. There is impacted cerumen.     Nose: Nose normal.     Mouth/Throat:     Mouth: Mucous membranes are moist.  Eyes:     General: Lids are normal.     Extraocular Movements: Extraocular movements intact.     Conjunctiva/sclera: Conjunctivae normal.     Pupils: Pupils are equal, round, and reactive to light.     Funduscopic exam:    Right eye: No papilledema.        Left eye: No papilledema.  Neck:     Thyroid: No thyroid mass.     Vascular: No carotid bruit.  Cardiovascular:     Rate and Rhythm: Normal rate and regular rhythm.     Pulses: Normal pulses.     Heart sounds: Normal heart sounds. No murmur heard. Pulmonary:     Effort: Pulmonary effort is normal. No respiratory distress.     Breath sounds: Normal breath sounds. No wheezing.  Chest:     Chest wall: No mass.  Breasts:    Tanner Score is 5.     Right: Normal. No mass or tenderness.     Left: Normal. No mass or tenderness.  Abdominal:     General: Abdomen is flat. Bowel sounds are normal. There is no distension.     Palpations: Abdomen is soft.     Tenderness: There is no abdominal tenderness.  Musculoskeletal:        General: No swelling or tenderness. Normal range of  motion.     Cervical back: Full passive range of motion without pain, normal range of motion and neck supple.     Right lower leg: No edema.     Left lower leg: No edema.     Comments: Left ankle with walking boot  Lymphadenopathy:     Upper Body:     Right upper body: No supraclavicular, axillary or pectoral adenopathy.     Left upper body: No supraclavicular, axillary or pectoral adenopathy.  Skin:    General:  Skin is warm and dry.     Capillary Refill: Capillary refill takes less than 2 seconds.  Neurological:     General: No focal deficit present.     Mental Status: She is alert and oriented to person, place, and time.     Cranial Nerves: No cranial nerve deficit.     Sensory: No sensory deficit.  Psychiatric:        Mood and Affect: Mood normal.        Behavior: Behavior normal.        Thought Content: Thought content normal.        Judgment: Judgment normal.         Assessment And Plan:     Encounter for annual health examination Assessment & Plan: Behavior modifications discussed and diet history reviewed.   Pt will continue to exercise regularly and modify diet with low GI, plant based foods and decrease intake of processed foods.  Recommend intake of daily multivitamin, Vitamin D, and calcium.  Recommend mammogram and colonoscopy for preventive screenings, as well as recommend immunizations that include influenza, TDAP, and Shingles Will get records from Dr. Thomes Lolling office to determine if had PAP with removal of cervix    Herpes zoster vaccination declined Assessment & Plan: Declines shingrix, educated on disease process and is aware if he changes his mind to notify office    Primary hypertension Assessment & Plan: Blood pressure is well controlled, continue current medications.  EKG done NSR HR 66  Orders: -     EKG 12-Lead -     POCT URINALYSIS DIP (CLINITEK) -     Microalbumin / creatinine urine ratio -     CMP14+EGFR  Body mass index (BMI) 28.0-28.9,  adult Assessment & Plan: Congratulated on her 9 lb weight loss, continue healthy diet.    Mixed hyperlipidemia Assessment & Plan: Cholesterol levels are stable. Continue statin, tolerating well   Orders: -     Lipid panel  Vitamin D deficiency Assessment & Plan: Will check vitamin D level and supplement as needed.    Also encouraged to spend 15 minutes in the sun daily.    Orders: -     VITAMIN D 25 Hydroxy (Vit-D Deficiency, Fractures)  Prediabetes Assessment & Plan: HgbA1c is up slightly at last visit, hopes it will be lower due to her 9 lb weight loss.   Orders: -     Hemoglobin A1c  Other depression Assessment & Plan: Depression screen score 9 however she is out of work due to a fractured ankle.     Tobacco abuse Assessment & Plan: She is working on quitting smoking, plans to start with nicotine patches realizes her slow healing of her left ankle may be related to her smoking.    Hearing loss of right ear, unspecified hearing loss type Assessment & Plan: Will refer to ENT for further evaluation, gross hearing intact.  Orders: -     Ambulatory referral to ENT  Impacted cerumen of left ear Assessment & Plan: Moderate amount of firm cerumen removed with lighted curette. Discussed using 1/2 water and 1/2 peroxide or debrox drops to ears to clean avoid qtips   History of sickle cell trait  Other long term (current) drug therapy -     CBC with Differential/Platelet     Return for 1 year physical, 6 month bp check. Patient was given opportunity to ask questions. Patient verbalized understanding of the plan and was able to repeat key elements of the plan.  All questions were answered to their satisfaction.   Arnette Felts, FNP  I, Arnette Felts, FNP, have reviewed all documentation for this visit. The documentation on 10/10/22 for the exam, diagnosis, procedures, and orders are all accurate and complete.

## 2022-10-10 NOTE — Assessment & Plan Note (Signed)
She is working on quitting smoking, plans to start with nicotine patches realizes her slow healing of her left ankle may be related to her smoking.

## 2022-10-10 NOTE — Assessment & Plan Note (Signed)
HgbA1c is up slightly at last visit, hopes it will be lower due to her 9 lb weight loss.

## 2022-10-10 NOTE — Assessment & Plan Note (Signed)
Will check vitamin D level and supplement as needed.    Also encouraged to spend 15 minutes in the sun daily.   

## 2022-10-10 NOTE — Assessment & Plan Note (Addendum)
Blood pressure is well controlled, continue current medications.  EKG done NSR HR 66

## 2022-10-10 NOTE — Patient Instructions (Signed)
Health Maintenance  Topic Date Due   Pap Smear  Never done   Screening for Lung Cancer  Never done   Colon Cancer Screening  04/24/2021   COVID-19 Vaccine (4 - 2023-24 season) 10/26/2021   Flu Shot  09/26/2022   Zoster (Shingles) Vaccine (1 of 2) 01/10/2023*   Mammogram  02/12/2024   DTaP/Tdap/Td vaccine (2 - Td or Tdap) 04/14/2031   Hepatitis C Screening  Completed   HIV Screening  Completed   HPV Vaccine  Aged Out  *Topic was postponed. The date shown is not the original due date.

## 2022-10-10 NOTE — Assessment & Plan Note (Signed)
Congratulated on her 9 lb weight loss, continue healthy diet.

## 2022-10-10 NOTE — Assessment & Plan Note (Signed)
Will refer to ENT for further evaluation, gross hearing intact.

## 2022-10-10 NOTE — Assessment & Plan Note (Signed)
Moderate amount of firm cerumen removed with lighted curette. Discussed using 1/2 water and 1/2 peroxide or debrox drops to ears to clean avoid qtips

## 2022-10-10 NOTE — Assessment & Plan Note (Signed)
Depression screen score 9 however she is out of work due to a fractured ankle.

## 2022-10-10 NOTE — Assessment & Plan Note (Addendum)
Behavior modifications discussed and diet history reviewed.   Pt will continue to exercise regularly and modify diet with low GI, plant based foods and decrease intake of processed foods.  Recommend intake of daily multivitamin, Vitamin D, and calcium.  Recommend mammogram and colonoscopy for preventive screenings, as well as recommend immunizations that include influenza, TDAP, and Shingles Will get records from Dr. Thomes Lolling office to determine if had PAP with removal of cervix

## 2022-10-10 NOTE — Assessment & Plan Note (Signed)
Declines shingrix, educated on disease process and is aware if he changes his mind to notify office  

## 2022-10-11 LAB — LIPID PANEL
Chol/HDL Ratio: 5.8 ratio — ABNORMAL HIGH (ref 0.0–4.4)
Cholesterol, Total: 271 mg/dL — ABNORMAL HIGH (ref 100–199)
HDL: 47 mg/dL (ref >39)
LDL Chol Calc (NIH): 195 mg/dL — ABNORMAL HIGH (ref 0–99)
Triglycerides: 158 mg/dL — ABNORMAL HIGH (ref 0–149)
VLDL Cholesterol Cal: 29 mg/dL (ref 5–40)

## 2022-10-11 LAB — CMP14+EGFR
ALT: 18 IU/L (ref 0–32)
AST: 14 IU/L (ref 0–40)
Albumin: 4.2 g/dL (ref 3.9–4.9)
Alkaline Phosphatase: 119 IU/L (ref 44–121)
BUN/Creatinine Ratio: 9 — ABNORMAL LOW (ref 12–28)
BUN: 9 mg/dL (ref 8–27)
Bilirubin Total: <0.2 mg/dL (ref 0.0–1.2)
CO2: 24 mmol/L (ref 20–29)
Calcium: 9.7 mg/dL (ref 8.7–10.3)
Chloride: 102 mmol/L (ref 96–106)
Creatinine, Ser: 1.02 mg/dL — ABNORMAL HIGH (ref 0.57–1.00)
Globulin, Total: 3 g/dL (ref 1.5–4.5)
Glucose: 89 mg/dL (ref 70–99)
Potassium: 3.7 mmol/L (ref 3.5–5.2)
Sodium: 139 mmol/L (ref 134–144)
Total Protein: 7.2 g/dL (ref 6.0–8.5)
eGFR: 62 mL/min/{1.73_m2} (ref >59)

## 2022-10-11 LAB — CBC WITH DIFFERENTIAL/PLATELET
Basophils Absolute: 0.1 10*3/uL (ref 0.0–0.2)
Basos: 1 %
EOS (ABSOLUTE): 0.6 10*3/uL — ABNORMAL HIGH (ref 0.0–0.4)
Eos: 6 %
Hematocrit: 32.9 % — ABNORMAL LOW (ref 34.0–46.6)
Hemoglobin: 10.7 g/dL — ABNORMAL LOW (ref 11.1–15.9)
Immature Grans (Abs): 0 10*3/uL (ref 0.0–0.1)
Immature Granulocytes: 0 %
Lymphocytes Absolute: 2.6 10*3/uL (ref 0.7–3.1)
Lymphs: 25 %
MCH: 22.7 pg — ABNORMAL LOW (ref 26.6–33.0)
MCHC: 32.5 g/dL (ref 31.5–35.7)
MCV: 70 fL — ABNORMAL LOW (ref 79–97)
Monocytes Absolute: 0.6 10*3/uL (ref 0.1–0.9)
Monocytes: 6 %
Neutrophils Absolute: 6.5 10*3/uL (ref 1.4–7.0)
Neutrophils: 62 %
Platelets: 374 10*3/uL (ref 150–450)
RBC: 4.71 x10E6/uL (ref 3.77–5.28)
RDW: 15.5 % — ABNORMAL HIGH (ref 11.7–15.4)
WBC: 10.4 10*3/uL (ref 3.4–10.8)

## 2022-10-11 LAB — MICROALBUMIN / CREATININE URINE RATIO
Creatinine, Urine: 204.8 mg/dL
Microalb/Creat Ratio: 7 mg/g{creat} (ref 0–29)
Microalbumin, Urine: 15.3 ug/mL

## 2022-10-11 LAB — HEMOGLOBIN A1C
Est. average glucose Bld gHb Est-mCnc: 126 mg/dL
Hgb A1c MFr Bld: 6 % — ABNORMAL HIGH (ref 4.8–5.6)

## 2022-10-11 LAB — VITAMIN D 25 HYDROXY (VIT D DEFICIENCY, FRACTURES): Vit D, 25-Hydroxy: 20.6 ng/mL — ABNORMAL LOW (ref 30.0–100.0)

## 2022-10-23 ENCOUNTER — Other Ambulatory Visit: Payer: Self-pay | Admitting: Nurse Practitioner

## 2022-10-23 DIAGNOSIS — H9191 Unspecified hearing loss, right ear: Secondary | ICD-10-CM

## 2022-10-23 DIAGNOSIS — E559 Vitamin D deficiency, unspecified: Secondary | ICD-10-CM

## 2022-10-23 MED ORDER — VITAMIN D (ERGOCALCIFEROL) 1.25 MG (50000 UNIT) PO CAPS
50000.0000 [IU] | ORAL_CAPSULE | ORAL | 1 refills | Status: DC
Start: 2022-10-24 — End: 2023-04-21

## 2022-11-05 ENCOUNTER — Encounter: Payer: Self-pay | Admitting: Orthopedic Surgery

## 2022-11-05 ENCOUNTER — Other Ambulatory Visit: Payer: Self-pay | Admitting: Orthopedic Surgery

## 2022-11-05 DIAGNOSIS — Z8781 Personal history of (healed) traumatic fracture: Secondary | ICD-10-CM

## 2022-11-08 ENCOUNTER — Ambulatory Visit
Admission: RE | Admit: 2022-11-08 | Discharge: 2022-11-08 | Disposition: A | Discharge status: Home / Self Care | Payer: BC Managed Care – PPO | Source: Ambulatory Visit | Attending: Orthopedic Surgery | Admitting: Orthopedic Surgery

## 2022-11-08 DIAGNOSIS — Z8781 Personal history of (healed) traumatic fracture: Secondary | ICD-10-CM

## 2023-01-21 ENCOUNTER — Other Ambulatory Visit: Payer: Self-pay | Admitting: Nurse Practitioner

## 2023-01-21 DIAGNOSIS — F3289 Other specified depressive episodes: Secondary | ICD-10-CM

## 2023-01-21 DIAGNOSIS — Z862 Personal history of diseases of the blood and blood-forming organs and certain disorders involving the immune mechanism: Secondary | ICD-10-CM

## 2023-04-14 ENCOUNTER — Ambulatory Visit: Payer: BC Managed Care – PPO | Admitting: Nurse Practitioner

## 2023-04-14 ENCOUNTER — Encounter: Payer: Self-pay | Admitting: Nurse Practitioner

## 2023-04-14 VITALS — BP 130/80 | HR 88 | Temp 98.5°F | Ht 67.0 in | Wt 197.6 lb

## 2023-04-14 DIAGNOSIS — E66811 Obesity, class 1: Secondary | ICD-10-CM

## 2023-04-14 DIAGNOSIS — R7303 Prediabetes: Secondary | ICD-10-CM | POA: Diagnosis not present

## 2023-04-14 DIAGNOSIS — E6609 Other obesity due to excess calories: Secondary | ICD-10-CM

## 2023-04-14 DIAGNOSIS — E782 Mixed hyperlipidemia: Secondary | ICD-10-CM | POA: Diagnosis not present

## 2023-04-14 DIAGNOSIS — R5383 Other fatigue: Secondary | ICD-10-CM | POA: Diagnosis not present

## 2023-04-14 DIAGNOSIS — I10 Essential (primary) hypertension: Secondary | ICD-10-CM | POA: Diagnosis not present

## 2023-04-14 DIAGNOSIS — Z2821 Immunization not carried out because of patient refusal: Secondary | ICD-10-CM | POA: Insufficient documentation

## 2023-04-14 DIAGNOSIS — E559 Vitamin D deficiency, unspecified: Secondary | ICD-10-CM

## 2023-04-14 DIAGNOSIS — Z862 Personal history of diseases of the blood and blood-forming organs and certain disorders involving the immune mechanism: Secondary | ICD-10-CM

## 2023-04-14 DIAGNOSIS — H9311 Tinnitus, right ear: Secondary | ICD-10-CM

## 2023-04-14 DIAGNOSIS — Z6830 Body mass index (BMI) 30.0-30.9, adult: Secondary | ICD-10-CM

## 2023-04-14 DIAGNOSIS — R0683 Snoring: Secondary | ICD-10-CM

## 2023-04-14 NOTE — Patient Instructions (Signed)
 Call DR Loreta Ave [phone] for your colonoscopy

## 2023-04-14 NOTE — Progress Notes (Signed)
 Carla Clements, CMA,acting as a Neurosurgeon for Carla Felts, FNP.,have documented all relevant documentation on the behalf of Carla Felts, FNP,as directed by  Carla Felts, FNP while in the presence of Carla Felts, FNP.  Subjective:  Patient ID: Carla Clements , female    DOB: [birth year] , 63 y.o.   MRN: [id number]  Chief Complaint  Patient presents with   Hypertension    HPI  Patient presents today for a bp and pre dm follow up, Patient reports compliance with medication. Patient denies any chest pain, SOB, or headaches.   Patient reports her energy level is not where she wants it, she reports she drinks 2 energy drinks and coffee some days. She reports she doesn't want to do anything, she reports this first started after she broke her foot in June 2024 and was out of work for 6 months. She is back at [employer]. She is not exercising regular after fracturing her ankle.   She reports she has ringing in her right ear that never goes away.   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      Past Medical History:  Diagnosis Date   Hypercholesteremia    Hypertension      Family History  Problem Relation Age of Onset   Hypertension Mother    Emphysema Mother    Cirrhosis Father      Current Outpatient Medications:    escitalopram (LEXAPRO) 10 MG tablet, TAKE 1 TABLET BY MOUTH EVERY DAY, Disp: 90 tablet, Rfl: 1   ferrous sulfate 325 (65 FE) MG EC tablet, TAKE 1 TABLET BY MOUTH TWICE A DAY, Disp: 180 tablet, Rfl: 1   losartan-hydrochlorothiazide (HYZAAR) 50-12.5 MG tablet, Take 1 tablet by mouth daily., Disp: 90 tablet, Rfl: 1   simvastatin (ZOCOR) 20 MG tablet, Take 1 tablet (20 mg total) by mouth at bedtime. (Patient not taking: Reported on 04/14/2023), Disp: 90 tablet, Rfl: 1   Vitamin D, Ergocalciferol, (DRISDOL) 1.25 MG (50000 UNIT) CAPS capsule, Take 1 capsule (50,000 Units total) by mouth 2 (two) times a week. (Patient not taking: Reported on 04/14/2023), Disp: 24  capsule, Rfl: 1   Allergies  Allergen Reactions   Citric Acid Other (See Comments)   Other Swelling    peanuts   Peanut-Containing Drug Products Other (See Comments)     Review of Systems  Constitutional:  Positive for fatigue.  Respiratory: Negative.    Cardiovascular: Negative.   Gastrointestinal: Negative.   Musculoskeletal:  Positive for arthralgias and myalgias.  Neurological: Negative.   Psychiatric/Behavioral: Negative.       Today's Vitals   04/14/23 1510  BP: 130/80  Pulse: 88  Temp: 98.5 F (36.9 C)  TempSrc: Oral  Weight: 197 lb 9.6 oz (89.6 kg)  Height: 5\' 7"  (1.702 m)  PainSc: 0-No pain   Body mass index is 30.95 kg/m.  Wt Readings from Last 3 Encounters:  04/14/23 197 lb 9.6 oz (89.6 kg)  10/10/22 181 lb (82.1 kg)  07/27/22 190 lb 0.6 oz (86.2 kg)       10/10/2022    2:59 PM 10/10/2022    2:13 PM 06/10/2022   12:02 PM 03/25/2022   12:12 PM 02/07/2022    9:51 AM  Depression screen PHQ 2/9  Decreased Interest 2 0 0 0 0  Down, Depressed, Hopeless 1 0 0 0 0  PHQ - 2 Score 3 0 0 0 0  Altered sleeping 0 0 2    Tired, decreased energy  3  2    Change in appetite 3  0    Feeling bad or failure about yourself  0  0    Trouble concentrating 0  0    Moving slowly or fidgety/restless 0  0    Suicidal thoughts 0  0    PHQ-9 Score 9 0 4    Difficult doing work/chores Not difficult at all  Not difficult at all       Objective:  Physical Exam Vitals reviewed.  Constitutional:      General: She is not in acute distress.    Appearance: Normal appearance. She is well-developed. She is obese.  HENT:     Head: Normocephalic and atraumatic.  Eyes:     Pupils: Pupils are equal, round, and reactive to light.  Cardiovascular:     Rate and Rhythm: Normal rate and regular rhythm.     Pulses: Normal pulses.     Heart sounds: Normal heart sounds. No murmur heard. Pulmonary:     Effort: Pulmonary effort is normal. No respiratory distress.     Breath sounds:  Normal breath sounds. No wheezing.  Musculoskeletal:        General: Normal range of motion.  Skin:    General: Skin is warm and dry.     Capillary Refill: Capillary refill takes less than 2 seconds.  Neurological:     General: No focal deficit present.     Mental Status: She is alert and oriented to person, place, and time.     Cranial Nerves: No cranial nerve deficit.     Motor: No weakness.  Psychiatric:        Mood and Affect: Mood normal.      Assessment And Plan:  Primary hypertension Assessment & Plan: Blood pressure is fairly controlled, continue current medications.    Orders: -     BMP8+eGFR  Prediabetes Assessment & Plan: HgbA1c stable.  Continue focusing on weight loss and limiting intake of sugary foods and drinks.  Orders: -     Hemoglobin A1c  Mixed hyperlipidemia Assessment & Plan: Cholesterol levels are stable. Continue statin, tolerating well   Orders: -     Lipid panel  History of sickle cell trait  Other fatigue Assessment & Plan: Will check for metabolic causes  Orders: -     VITAMIN D 25 Hydroxy (Vit-D Deficiency, Fractures) -     Vitamin B12 -     Iron, TIBC and Ferritin Panel -     CBC with Differential/Platelet -     TSH  Snores Assessment & Plan: Will refer for sleep study this may be affecting her weight.   Orders: -     Ambulatory referral to Sleep Studies  Tinnitus of right ear Assessment & Plan: This has been persistent will refer to ENT  Orders: -     Ambulatory referral to ENT  COVID-19 vaccination declined Assessment & Plan: Declines covid 19 vaccine. Discussed risk of covid 50 and if she changes her mind about the vaccine to call the office. Education has been provided regarding the importance of this vaccine but patient still declined. Advised may receive this vaccine at local pharmacy or Health Dept.or vaccine clinic. Aware to provide a copy of the vaccination record if obtained from local pharmacy or Health Dept.   Encouraged to take multivitamin, vitamin d, vitamin c and zinc to increase immune system. Aware can call office if would like to have vaccine here at office. Verbalized acceptance and  understanding.    Influenza vaccination declined Assessment & Plan: Patient declined influenza vaccination at this time. Patient is aware that influenza vaccine prevents illness in 70% of healthy people, and reduces hospitalizations to 30-70% in elderly. This vaccine is recommended annually. Education has been provided regarding the importance of this vaccine but patient still declined. Advised may receive this vaccine at local pharmacy or Health Dept.or vaccine clinic. Aware to provide a copy of the vaccination record if obtained from local pharmacy or Health Dept.  Pt is willing to accept risk associated with refusing vaccination.    Herpes zoster vaccination declined Assessment & Plan: Declines shingrix, educated on disease process and is aware if he changes his mind to notify office    Class 1 obesity due to excess calories with body mass index (BMI) of 30.0 to 30.9 in adult, unspecified whether serious comorbidity present Assessment & Plan: She is encouraged to strive for BMI less than 30 to decrease cardiac risk. Advised to aim for at least 150 minutes of exercise per week.      Return for 6 month bp check.  Patient was given opportunity to ask questions. Patient verbalized understanding of the plan and was able to repeat key elements of the plan. All questions were answered to their satisfaction.    Carla Sparrow, FNP, have reviewed all documentation for this visit. The documentation on 04/14/23 for the exam, diagnosis, procedures, and orders are all accurate and complete.   IF YOU HAVE BEEN REFERRED TO A SPECIALIST, IT MAY TAKE 1-2 WEEKS TO SCHEDULE/PROCESS THE REFERRAL. IF YOU HAVE NOT HEARD FROM US/SPECIALIST IN TWO WEEKS, PLEASE GIVE [place] A CALL AT [phone].

## 2023-04-15 LAB — CBC WITH DIFFERENTIAL/PLATELET
Basophils Absolute: 0.1 10*3/uL (ref 0.0–0.2)
Basos: 1 %
EOS (ABSOLUTE): 0.5 10*3/uL — ABNORMAL HIGH (ref 0.0–0.4)
Eos: 6 %
Hematocrit: 35.3 % (ref 34.0–46.6)
Hemoglobin: 11.1 g/dL (ref 11.1–15.9)
Immature Grans (Abs): 0 10*3/uL (ref 0.0–0.1)
Immature Granulocytes: 0 %
Lymphocytes Absolute: 3 10*3/uL (ref 0.7–3.1)
Lymphs: 31 %
MCH: 22.8 pg — ABNORMAL LOW (ref 26.6–33.0)
MCHC: 31.4 g/dL — ABNORMAL LOW (ref 31.5–35.7)
MCV: 73 fL — ABNORMAL LOW (ref 79–97)
Monocytes Absolute: 0.6 10*3/uL (ref 0.1–0.9)
Monocytes: 7 %
Neutrophils Absolute: 5.5 10*3/uL (ref 1.4–7.0)
Neutrophils: 55 %
Platelets: 394 10*3/uL (ref 150–450)
RBC: 4.86 x10E6/uL (ref 3.77–5.28)
RDW: 15.9 % — ABNORMAL HIGH (ref 11.7–15.4)
WBC: 9.7 10*3/uL (ref 3.4–10.8)

## 2023-04-15 LAB — BMP8+EGFR
BUN/Creatinine Ratio: 17 (ref 12–28)
BUN: 16 mg/dL (ref 8–27)
CO2: 22 mmol/L (ref 20–29)
Calcium: 10.2 mg/dL (ref 8.7–10.3)
Chloride: 102 mmol/L (ref 96–106)
Creatinine, Ser: 0.92 mg/dL (ref 0.57–1.00)
Glucose: 82 mg/dL (ref 70–99)
Potassium: 4 mmol/L (ref 3.5–5.2)
Sodium: 139 mmol/L (ref 134–144)
eGFR: 70 mL/min/{1.73_m2} (ref >59)

## 2023-04-15 LAB — HEMOGLOBIN A1C
Est. average glucose Bld gHb Est-mCnc: 126 mg/dL
Hgb A1c MFr Bld: 6 % — ABNORMAL HIGH (ref 4.8–5.6)

## 2023-04-15 LAB — LIPID PANEL
Chol/HDL Ratio: 5.6 {ratio} — ABNORMAL HIGH (ref 0.0–4.4)
Cholesterol, Total: 275 mg/dL — ABNORMAL HIGH (ref 100–199)
HDL: 49 mg/dL (ref >39)
LDL Chol Calc (NIH): 174 mg/dL — ABNORMAL HIGH (ref 0–99)
Triglycerides: 272 mg/dL — ABNORMAL HIGH (ref 0–149)
VLDL Cholesterol Cal: 52 mg/dL — ABNORMAL HIGH (ref 5–40)

## 2023-04-15 LAB — TSH: TSH: 1.61 u[IU]/mL (ref 0.450–4.500)

## 2023-04-15 LAB — IRON,TIBC AND FERRITIN PANEL
Ferritin: 119 ng/mL (ref 15–150)
Iron Saturation: 18 % (ref 15–55)
Iron: 47 ug/dL (ref 27–139)
Total Iron Binding Capacity: 263 ug/dL (ref 250–450)
UIBC: 216 ug/dL (ref 118–369)

## 2023-04-15 LAB — VITAMIN D 25 HYDROXY (VIT D DEFICIENCY, FRACTURES): Vit D, 25-Hydroxy: 14.4 ng/mL — ABNORMAL LOW (ref 30.0–100.0)

## 2023-04-15 LAB — VITAMIN B12: Vitamin B-12: 1093 pg/mL (ref 232–1245)

## 2023-04-21 ENCOUNTER — Encounter: Payer: Self-pay | Admitting: Nurse Practitioner

## 2023-04-21 MED ORDER — VITAMIN D (ERGOCALCIFEROL) 1.25 MG (50000 UNIT) PO CAPS: 50000.0000 [IU] | ORAL_CAPSULE | ORAL | 1 refills | Status: DC

## 2023-04-21 NOTE — Assessment & Plan Note (Signed)
Will check for metabolic causes.

## 2023-04-21 NOTE — Assessment & Plan Note (Signed)

## 2023-04-21 NOTE — Assessment & Plan Note (Signed)

## 2023-04-21 NOTE — Assessment & Plan Note (Signed)
 Declines shingrix, educated on disease process and is aware if he changes his mind to notify office

## 2023-04-21 NOTE — Assessment & Plan Note (Signed)
 This has been persistent will refer to ENT

## 2023-04-21 NOTE — Assessment & Plan Note (Signed)
Blood pressure is fairly controlled, continue current medications.  

## 2023-04-21 NOTE — Assessment & Plan Note (Signed)
 HgbA1c stable.  Continue focusing on weight loss and limiting intake of sugary foods and drinks.

## 2023-04-21 NOTE — Assessment & Plan Note (Signed)
 Will refer for sleep study this may be affecting her weight.

## 2023-04-21 NOTE — Assessment & Plan Note (Signed)
 She is encouraged to strive for BMI less than 30 to decrease cardiac risk. Advised to aim for at least 150 minutes of exercise per week.

## 2023-04-21 NOTE — Assessment & Plan Note (Signed)
Cholesterol levels are stable.  Continue statin, tolerating well.

## 2023-04-24 ENCOUNTER — Encounter (INDEPENDENT_AMBULATORY_CARE_PROVIDER_SITE_OTHER): Payer: Self-pay | Admitting: Otolaryngology

## 2023-05-09 ENCOUNTER — Other Ambulatory Visit: Payer: Self-pay | Admitting: Nurse Practitioner

## 2023-05-09 DIAGNOSIS — F3289 Other specified depressive episodes: Secondary | ICD-10-CM

## 2023-05-09 NOTE — Telephone Encounter (Signed)
 Copied from CRM [id number]. Topic: Clinical - Medication Refill >> May 09, 2023  9:34 AM Clide Dales wrote: Most Recent Primary Care Visit:  Provider: Arnette Felts  Department: [facility]  Visit Type: OFFICE VISIT  Date: 04/14/2023  Medication: losartan-hydrochlorothiazide (HYZAAR) 50-12.5 MG tablet  Has the patient contacted their pharmacy? No (Agent: If no, request that the patient contact the pharmacy for the refill. If patient does not wish to contact the pharmacy document the reason why and proceed with request.) (Agent: If yes, when and what did the pharmacy advise?)  Is this the correct pharmacy for this prescription? Yes If no, delete pharmacy and type the correct one.  This is the patient's preferred pharmacy:  [id number] - [place], [place] - [address] AT [address]NER OF [facility] [address] [place] [address] Phone: [phone] Fax: [phone]     Has the prescription been filled recently? No  Is the patient out of the medication? Yes  Has the patient been seen for an appointment in the last year OR does the patient have an upcoming appointment? Yes  Can we respond through MyChart? Yes  Agent: Please be advised that Rx refills may take up to 3 business days. We ask that you follow-up with your pharmacy.

## 2023-06-03 ENCOUNTER — Other Ambulatory Visit: Payer: Self-pay

## 2023-06-03 MED ORDER — LOSARTAN POTASSIUM-HCTZ 50-12.5 MG PO TABS: 1.0000 | ORAL_TABLET | Freq: Every day | ORAL | Status: DC

## 2023-06-05 NOTE — Telephone Encounter (Signed)
 Copied from CRM [id number]. Topic: Clinical - Medication Refill >> Jun 05, 2023 12:43 PM Beacher May wrote: Most Recent Primary Care Visit:  Provider: Arnette Felts  Department: [facility]  Visit Type: OFFICE VISIT  Date: 04/14/2023  Medication: losartan-hydrochlorothiazide (HYZAAR) 50-12.5 MG tablet  Has the patient contacted their pharmacy? Yes (Agent: If no, request that the patient contact the pharmacy for the refill. If patient does not wish to contact the pharmacy document the reason why and proceed with request.) (Agent: If yes, when and what did the pharmacy advise?)  Is this the correct pharmacy for this prescription? Yes If no, delete pharmacy and type the correct one.  This is the patient's preferred pharmacy:  [id number] - [place], [place] - [address] AT [address]NER OF [facility] [address] [place] [address] Phone: [phone] Fax: [phone]     Has the prescription been filled recently? No  Is the patient out of the medication? Yes  Has the patient been seen for an appointment in the last year OR does the patient have an upcoming appointment? Yes  Can we respond through MyChart? Yes  Agent: Please be advised that Rx refills may take up to 3 business days. We ask that you follow-up with your pharmacy.

## 2023-06-09 ENCOUNTER — Other Ambulatory Visit: Payer: Self-pay

## 2023-06-09 MED ORDER — LOSARTAN POTASSIUM-HCTZ 50-12.5 MG PO TABS: 1.0000 | ORAL_TABLET | Freq: Every day | ORAL | 2 refills | Status: AC

## 2023-10-13 ENCOUNTER — Encounter: Payer: Self-pay | Admitting: Nurse Practitioner

## 2023-10-13 ENCOUNTER — Ambulatory Visit: Payer: BC Managed Care – PPO | Admitting: Nurse Practitioner

## 2023-10-13 VITALS — BP 130/96 | HR 81 | Temp 98.6°F | Ht 67.0 in | Wt 193.6 lb

## 2023-10-13 DIAGNOSIS — I10 Essential (primary) hypertension: Secondary | ICD-10-CM

## 2023-10-13 DIAGNOSIS — Z2821 Immunization not carried out because of patient refusal: Secondary | ICD-10-CM

## 2023-10-13 DIAGNOSIS — I700 Atherosclerosis of aorta: Secondary | ICD-10-CM

## 2023-10-13 DIAGNOSIS — R7303 Prediabetes: Secondary | ICD-10-CM

## 2023-10-13 DIAGNOSIS — Z6830 Body mass index (BMI) 30.0-30.9, adult: Secondary | ICD-10-CM

## 2023-10-13 DIAGNOSIS — E6609 Other obesity due to excess calories: Secondary | ICD-10-CM

## 2023-10-13 DIAGNOSIS — E559 Vitamin D deficiency, unspecified: Secondary | ICD-10-CM

## 2023-10-13 DIAGNOSIS — E782 Mixed hyperlipidemia: Secondary | ICD-10-CM | POA: Diagnosis not present

## 2023-10-13 DIAGNOSIS — M62838 Other muscle spasm: Secondary | ICD-10-CM

## 2023-10-13 DIAGNOSIS — R5383 Other fatigue: Secondary | ICD-10-CM

## 2023-10-13 DIAGNOSIS — E66811 Obesity, class 1: Secondary | ICD-10-CM

## 2023-10-13 DIAGNOSIS — F3289 Other specified depressive episodes: Secondary | ICD-10-CM

## 2023-10-13 MED ORDER — ESCITALOPRAM OXALATE 10 MG PO TABS: 10.0000 mg | ORAL_TABLET | Freq: Every day | ORAL | 1 refills | Status: AC

## 2023-10-13 MED ORDER — CYCLOBENZAPRINE HCL 10 MG PO TABS: 10.0000 mg | ORAL_TABLET | Freq: Three times a day (TID) | ORAL | 0 refills | Status: DC | PRN

## 2023-10-13 MED ORDER — SIMVASTATIN 20 MG PO TABS: 20.0000 mg | ORAL_TABLET | Freq: Every day | ORAL | 1 refills | Status: AC

## 2023-10-13 NOTE — Progress Notes (Signed)
 LILLETTE Kristeen JINNY Gladis, CMA,acting as a Neurosurgeon for Carla Ada, FNP.,have documented all relevant documentation on the behalf of Carla Ada, FNP,as directed by  Carla Ada, FNP while in the presence of Carla Ada, FNP.  Subjective:  Patient ID: Carla Clements , female    DOB: 1960-06-19 , 63 y.o.   MRN: [id number]  Chief Complaint  Patient presents with   Hypertension    Patient presents today for a bp and pre dm follow up, Patient reports compliance with medication. Patient denies any chest pain, SOB, or headaches. Patient has no concerns today. Both arms measured 13inches.     HPI  HPI  Discussed the use of AI scribe software for clinical note transcription with the patient, who gave verbal consent to proceed.  History of Present Illness Carla Clements is a 63 year old female with hypertension who presents for a blood pressure follow-up.  She has been experiencing elevated blood pressure readings, with recent measurements of 131/100 mmHg and 131/96 mmHg. She reports working long hours and consuming two [employer] drinks daily. She is currently taking escitalopram  for her mood.   She experiences constipation from iron supplements, which she has stopped taking. To manage her iron levels, she has increased her vegetable intake. She is concerned about her cholesterol levels, which were last recorded as high, with a total cholesterol of 275 mg/dL and LDL of 804 mg/dL. She has been eating a lot of seafood and has stopped eating chicken.  She has a history of smoking, currently reduced to one or two cigarettes a day, down from five. She is trying to quit smoking and is encouraging her daughter to do the same.  She reports neck pain, which she attributes to sleeping stiffly and working long hours as a Museum/gallery exhibitions officer. She has been using Tylenol  650 mg for pain relief.  She mentions a history of a colonoscopy in 2018 and is unsure if she has had one since. She also discusses a referral  for a sleep study, which she has not yet scheduled. She has a history of interstitial cystitis and a hysterectomy, during which she previously used muscle relaxers.   Past Medical History:  Diagnosis Date   Hypercholesteremia    Hypertension      Family History  Problem Relation Age of Onset   Hypertension Mother    Emphysema Mother    Cirrhosis Father      Current Outpatient Medications:    cyclobenzaprine  (FLEXERIL ) 10 MG tablet, Take 1 tablet (10 mg total) by mouth 3 (three) times daily as needed., Disp: 30 tablet, Rfl: 0   ferrous sulfate  325 (65 FE) MG EC tablet, TAKE 1 TABLET BY MOUTH TWICE A DAY, Disp: 180 tablet, Rfl: 1   losartan -hydrochlorothiazide (HYZAAR) 50-12.5 MG tablet, Take 1 tablet by mouth daily., Disp: 90 tablet, Rfl: 2   escitalopram  (LEXAPRO ) 10 MG tablet, Take 1 tablet (10 mg total) by mouth daily., Disp: 90 tablet, Rfl: 1   simvastatin  (ZOCOR ) 20 MG tablet, Take 1 tablet (20 mg total) by mouth at bedtime., Disp: 90 tablet, Rfl: 1   Vitamin D , Ergocalciferol , (DRISDOL ) 1.25 MG (50000 UNIT) CAPS capsule, Take 1 capsule (50,000 Units total) by mouth 2 (two) times a week., Disp: 24 capsule, Rfl: 1   Allergies  Allergen Reactions   Citric Acid Other (See Comments)   Other Swelling    peanuts   Peanut-Containing Drug Products Other (See Comments)     Review of Systems  Constitutional:  Negative for fatigue.  Respiratory: Negative.    Cardiovascular: Negative.   Gastrointestinal: Negative.   Musculoskeletal:  Positive for arthralgias and myalgias.  Neurological: Negative.   Psychiatric/Behavioral: Negative.       Today's Vitals   10/13/23 1516 10/13/23 1530  BP: (!) 130/100 (!) 130/96  Pulse: 81   Temp: 98.6 F (37 C)   TempSrc: Oral   Weight: 193 lb 9.6 oz (87.8 kg)   Height: 5' 7 (1.702 m)   PainSc: 0-No pain    Body mass index is 30.32 kg/m.  Wt Readings from Last 3 Encounters:  10/13/23 193 lb 9.6 oz (87.8 kg)  04/14/23 197 lb 9.6 oz  (89.6 kg)  10/10/22 181 lb (82.1 kg)    The 10-year ASCVD risk score (Arnett DK, et al., 2019) is: 19.6%   Values used to calculate the score:     Age: 71 years     Clincally relevant sex: Female     Is Non-Hispanic African American: Yes     Diabetic: No     Tobacco smoker: Yes     Systolic Blood Pressure: 130 mmHg     Is BP treated: Yes     HDL Cholesterol: 48 mg/dL     Total Cholesterol: 236 mg/dL  Objective:  Physical Exam Vitals and nursing note reviewed.  Constitutional:      General: She is not in acute distress.    Appearance: Normal appearance. She is well-developed. She is obese.  HENT:     Head: Normocephalic and atraumatic.  Eyes:     Pupils: Pupils are equal, round, and reactive to light.  Cardiovascular:     Rate and Rhythm: Normal rate and regular rhythm.     Pulses: Normal pulses.     Clements sounds: Normal Clements sounds. No murmur heard. Pulmonary:     Effort: Pulmonary effort is normal. No respiratory distress.     Breath sounds: Normal breath sounds. No wheezing.  Musculoskeletal:        General: Normal range of motion.  Skin:    General: Skin is warm and dry.     Capillary Refill: Capillary refill takes less than 2 seconds.  Neurological:     General: No focal deficit present.     Mental Status: She is alert and oriented to person, place, and time.     Cranial Nerves: No cranial nerve deficit.     Motor: No weakness.  Psychiatric:        Mood and Affect: Mood normal.      Assessment And Plan:  Primary hypertension Assessment & Plan: Elevated blood pressure at 130/100 mmHg. Work stress and energy drinks may contribute. Possible atherosclerosis involvement. - Advise reduction or elimination of energy drinks. - Instruct to contact [employer] for a blood pressure cuff and measure arm for appropriate size. - Continue Losartan -hydrochlorothiazide (Hyzaar) 50-12.5 mg orally daily.  Orders: -     BMP8+eGFR  Prediabetes Assessment &  Plan: HgbA1c stable.  Continue focusing on weight loss and limiting intake of sugary foods and drinks.  Orders: -     Hemoglobin A1c  Mixed hyperlipidemia Assessment & Plan: Total cholesterol 275 mg/dL, LDL 804 mg/dL. Atherosclerosis in aorta. High seafood and red meat diet. Cardiovascular risk 22.1%. - Restart simvastatin  for cholesterol management. - Advise reduction of shrimp and red meat intake. - Encourage increased consumption of vegetables.  Orders: -     Lipid panel -     Simvastatin ; Take 1 tablet (  20 mg total) by mouth at bedtime.  Dispense: 90 tablet; Refill: 1  Herpes zoster vaccination declined  COVID-19 vaccination declined  Class 1 obesity due to excess calories with body mass index (BMI) of 30.0 to 30.9 in adult, unspecified whether serious comorbidity present Assessment & Plan:  Work schedule and energy drinks affect weight and energy. - Advise dietary modifications, including reducing red meat and increasing vegetable intake.   Vitamin D  deficiency Assessment & Plan: Will check vitamin D  level and supplement as needed.    Also encouraged to spend 15 minutes in the sun daily.    Orders: -     VITAMIN D  25 Hydroxy (Vit-D Deficiency, Fractures)  Other fatigue Assessment & Plan: Fatigue possibly related to long workdays, lifestyle, and diet. Energy drinks may contribute. - Consider a sleep study; contact neurology for scheduling. - Encourage reduction or elimination of energy drink consumption. - Encourage increased fiber intake to help with energy levels.  Orders: -     Iron, TIBC and Ferritin Panel -     TSH  Other depression Assessment & Plan: On escitalopram  10 mg daily. Stress from personal losses and work. Therapy could help. - Continue escitalopram  (Lexapro ) 10 mg orally daily. - Encourage seeking therapy through job's resources for additional support.  Orders: -     Escitalopram  Oxalate; Take 1 tablet (10 mg total) by mouth daily.  Dispense:  90 tablet; Refill: 1  Neck muscle spasm -     Cyclobenzaprine  HCl; Take 1 tablet (10 mg total) by mouth 3 (three) times daily as needed.  Dispense: 30 tablet; Refill: 0  Aortic atherosclerosis (HCC) Assessment & Plan: Will restart statin     Return in about 3 months (around 01/13/2024) for phy when able. .  Patient was given opportunity to ask questions. Patient verbalized understanding of the plan and was able to repeat key elements of the plan. All questions were answered to their satisfaction.    LILLETTE Carla Ada, FNP, have reviewed all documentation for this visit. The documentation on 10/13/23 for the exam, diagnosis, procedures, and orders are all accurate and complete.   IF YOU HAVE BEEN REFERRED TO A SPECIALIST, IT MAY TAKE 1-2 WEEKS TO SCHEDULE/PROCESS THE REFERRAL. IF YOU HAVE NOT HEARD FROM US /SPECIALIST IN TWO WEEKS, PLEASE GIVE US  A CALL AT [phone].

## 2023-10-14 LAB — BMP8+EGFR
BUN/Creatinine Ratio: 15 (ref 12–28)
BUN: 12 mg/dL (ref 8–27)
CO2: 21 mmol/L (ref 20–29)
Calcium: 9.6 mg/dL (ref 8.7–10.3)
Chloride: 105 mmol/L (ref 96–106)
Creatinine, Ser: 0.82 mg/dL (ref 0.57–1.00)
Glucose: 92 mg/dL (ref 70–99)
Potassium: 3.8 mmol/L (ref 3.5–5.2)
Sodium: 142 mmol/L (ref 134–144)
eGFR: 80 mL/min/1.73 (ref >59)

## 2023-10-14 LAB — LIPID PANEL
Chol/HDL Ratio: 4.9 ratio — ABNORMAL HIGH (ref 0.0–4.4)
Cholesterol, Total: 236 mg/dL — ABNORMAL HIGH (ref 100–199)
HDL: 48 mg/dL (ref >39)
LDL Chol Calc (NIH): 126 mg/dL — ABNORMAL HIGH (ref 0–99)
Triglycerides: 353 mg/dL — ABNORMAL HIGH (ref 0–149)
VLDL Cholesterol Cal: 62 mg/dL — ABNORMAL HIGH (ref 5–40)

## 2023-10-14 LAB — IRON,TIBC AND FERRITIN PANEL
Ferritin: 141 ng/mL (ref 15–150)
Iron Saturation: 16 % (ref 15–55)
Iron: 40 ug/dL (ref 27–139)
Total Iron Binding Capacity: 247 ug/dL — ABNORMAL LOW (ref 250–450)
UIBC: 207 ug/dL (ref 118–369)

## 2023-10-14 LAB — VITAMIN D 25 HYDROXY (VIT D DEFICIENCY, FRACTURES): Vit D, 25-Hydroxy: 23.9 ng/mL — ABNORMAL LOW (ref 30.0–100.0)

## 2023-10-14 LAB — HEMOGLOBIN A1C
Est. average glucose Bld gHb Est-mCnc: 120 mg/dL
Hgb A1c MFr Bld: 5.8 % — ABNORMAL HIGH (ref 4.8–5.6)

## 2023-10-14 LAB — TSH: TSH: 1.53 u[IU]/mL (ref 0.450–4.500)

## 2023-10-15 ENCOUNTER — Ambulatory Visit: Payer: Self-pay | Admitting: Nurse Practitioner

## 2023-10-15 ENCOUNTER — Other Ambulatory Visit (HOSPITAL_BASED_OUTPATIENT_CLINIC_OR_DEPARTMENT_OTHER): Payer: Self-pay

## 2023-10-15 DIAGNOSIS — E559 Vitamin D deficiency, unspecified: Secondary | ICD-10-CM

## 2023-10-15 MED ORDER — VITAMIN D (ERGOCALCIFEROL) 1.25 MG (50000 UNIT) PO CAPS
50000.0000 [IU] | ORAL_CAPSULE | ORAL | 1 refills | Status: AC
  Filled 2023-10-15: qty 8, 28d supply, fill #0

## 2023-10-23 DIAGNOSIS — I700 Atherosclerosis of aorta: Secondary | ICD-10-CM | POA: Insufficient documentation

## 2023-10-23 NOTE — Assessment & Plan Note (Signed)
 HgbA1c stable.  Continue focusing on weight loss and limiting intake of sugary foods and drinks.

## 2023-10-23 NOTE — Assessment & Plan Note (Signed)
 Work schedule and energy drinks affect weight and energy. - Advise dietary modifications, including reducing red meat and increasing vegetable intake.

## 2023-10-23 NOTE — Assessment & Plan Note (Signed)
Will restart statin.

## 2023-10-23 NOTE — Assessment & Plan Note (Signed)
 On escitalopram  10 mg daily. Stress from personal losses and work. Therapy could help. - Continue escitalopram  (Lexapro ) 10 mg orally daily. - Encourage seeking therapy through job's resources for additional support.

## 2023-10-23 NOTE — Assessment & Plan Note (Signed)
 Fatigue possibly related to long workdays, lifestyle, and diet. Energy drinks may contribute. - Consider a sleep study; contact neurology for scheduling. - Encourage reduction or elimination of energy drink consumption. - Encourage increased fiber intake to help with energy levels.

## 2023-10-23 NOTE — Assessment & Plan Note (Signed)
 Total cholesterol 275 mg/dL, LDL 804 mg/dL. Atherosclerosis in aorta. High seafood and red meat diet. Cardiovascular risk 22.1%. - Restart simvastatin  for cholesterol management. - Advise reduction of shrimp and red meat intake. - Encourage increased consumption of vegetables.

## 2023-10-23 NOTE — Assessment & Plan Note (Signed)
 Elevated blood pressure at 130/100 mmHg. Work stress and energy drinks may contribute. Possible atherosclerosis involvement. - Advise reduction or elimination of energy drinks. - Instruct to contact [employer] for a blood pressure cuff and measure arm for appropriate size. - Continue Losartan -hydrochlorothiazide (Hyzaar) 50-12.5 mg orally daily.

## 2023-10-23 NOTE — Assessment & Plan Note (Signed)
 Will check vitamin D  level and supplement as needed.    Also encouraged to spend 15 minutes in the sun daily.

## 2023-10-28 ENCOUNTER — Other Ambulatory Visit (HOSPITAL_BASED_OUTPATIENT_CLINIC_OR_DEPARTMENT_OTHER): Payer: Self-pay

## 2024-02-03 ENCOUNTER — Telehealth: Payer: Self-pay

## 2024-02-03 NOTE — Telephone Encounter (Signed)
 Called patient to reschedule appointment that she has on Thursday 02/05/24 with [name] because the provider is out of the office due to family emergency.   Please schedule patient the first available next year no available physical for the year of 2025 with [name] may schedule with NP Pat ohenhen if patient is ok with that.

## 2024-02-05 ENCOUNTER — Encounter: Payer: Self-pay | Admitting: Nurse Practitioner

## 2024-03-09 LAB — HM MAMMOGRAPHY

## 2024-03-10 ENCOUNTER — Encounter: Payer: Self-pay | Admitting: Nurse Practitioner

## 2024-03-26 ENCOUNTER — Other Ambulatory Visit: Payer: Self-pay | Admitting: Nurse Practitioner

## 2024-03-26 DIAGNOSIS — M62838 Other muscle spasm: Secondary | ICD-10-CM
